# Patient Record
Sex: Female | Born: 1945 | Race: Black or African American | Hispanic: No | Smoking: Former smoker
Health system: Southern US, Community
[De-identification: ages and names within clinical notes are randomized; demographics above are authoritative.]

## PROBLEM LIST (undated history)

## (undated) DIAGNOSIS — I10 Essential (primary) hypertension: Secondary | ICD-10-CM

## (undated) DIAGNOSIS — K449 Diaphragmatic hernia without obstruction or gangrene: Secondary | ICD-10-CM

## (undated) DIAGNOSIS — R921 Mammographic calcification found on diagnostic imaging of breast: Secondary | ICD-10-CM

## (undated) DIAGNOSIS — K219 Gastro-esophageal reflux disease without esophagitis: Secondary | ICD-10-CM

## (undated) DIAGNOSIS — K209 Esophagitis, unspecified without bleeding: Secondary | ICD-10-CM

## (undated) DIAGNOSIS — E876 Hypokalemia: Secondary | ICD-10-CM

## (undated) DIAGNOSIS — D649 Anemia, unspecified: Secondary | ICD-10-CM

## (undated) DIAGNOSIS — D509 Iron deficiency anemia, unspecified: Secondary | ICD-10-CM

## (undated) DIAGNOSIS — K750 Abscess of liver: Secondary | ICD-10-CM

## (undated) DIAGNOSIS — E559 Vitamin D deficiency, unspecified: Secondary | ICD-10-CM

## (undated) DIAGNOSIS — K759 Inflammatory liver disease, unspecified: Secondary | ICD-10-CM

## (undated) HISTORY — DX: Essential (primary) hypertension: I10

## (undated) HISTORY — DX: Diaphragmatic hernia without obstruction or gangrene: K44.9

## (undated) HISTORY — DX: Iron deficiency anemia, unspecified: D50.9

## (undated) HISTORY — PX: BREAST EXCISIONAL BIOPSY: SUR124

## (undated) HISTORY — DX: Esophagitis, unspecified: K20.9

## (undated) HISTORY — DX: Hypokalemia: E87.6

## (undated) HISTORY — DX: Vitamin D deficiency, unspecified: E55.9

## (undated) HISTORY — DX: Esophagitis, unspecified without bleeding: K20.90

## (undated) HISTORY — PX: TUBAL LIGATION: SHX77

## (undated) HISTORY — DX: Mammographic calcification found on diagnostic imaging of breast: R92.1

## (undated) HISTORY — DX: Anemia, unspecified: D64.9

## (undated) HISTORY — DX: Abscess of liver: K75.0

---

## 2002-08-24 ENCOUNTER — Emergency Department (HOSPITAL_COMMUNITY): Admission: EM | Admit: 2002-08-24 | Discharge: 2002-08-24 | Payer: Self-pay | Admitting: Emergency Medicine

## 2005-07-05 HISTORY — PX: LIVER BIOPSY: SHX301

## 2006-01-21 ENCOUNTER — Inpatient Hospital Stay (HOSPITAL_COMMUNITY): Admission: EM | Admit: 2006-01-21 | Discharge: 2006-02-09 | Payer: Self-pay | Admitting: Family Medicine

## 2006-02-14 ENCOUNTER — Ambulatory Visit (HOSPITAL_COMMUNITY): Admission: RE | Admit: 2006-02-14 | Discharge: 2006-02-14 | Payer: Self-pay | Admitting: Internal Medicine

## 2006-02-14 ENCOUNTER — Ambulatory Visit: Payer: Self-pay | Admitting: Internal Medicine

## 2006-02-15 ENCOUNTER — Ambulatory Visit: Payer: Self-pay | Admitting: *Deleted

## 2006-03-14 ENCOUNTER — Ambulatory Visit: Payer: Self-pay | Admitting: Internal Medicine

## 2006-03-17 ENCOUNTER — Ambulatory Visit (HOSPITAL_COMMUNITY): Admission: RE | Admit: 2006-03-17 | Discharge: 2006-03-17 | Payer: Self-pay | Admitting: Family Medicine

## 2006-03-21 ENCOUNTER — Ambulatory Visit: Payer: Self-pay | Admitting: Internal Medicine

## 2006-10-19 ENCOUNTER — Ambulatory Visit: Payer: Self-pay | Admitting: Internal Medicine

## 2006-11-02 ENCOUNTER — Ambulatory Visit (HOSPITAL_COMMUNITY): Admission: RE | Admit: 2006-11-02 | Discharge: 2006-11-02 | Payer: Self-pay | Admitting: Internal Medicine

## 2006-11-02 ENCOUNTER — Ambulatory Visit: Payer: Self-pay | Admitting: Family Medicine

## 2006-11-04 ENCOUNTER — Ambulatory Visit: Payer: Self-pay | Admitting: Internal Medicine

## 2006-11-24 ENCOUNTER — Ambulatory Visit: Payer: Self-pay | Admitting: Internal Medicine

## 2007-03-01 ENCOUNTER — Ambulatory Visit: Payer: Self-pay | Admitting: Internal Medicine

## 2007-03-01 LAB — CONVERTED CEMR LAB
Basophils Relative: 0 % (ref 0–1)
Eosinophils Relative: 4 % (ref 0–5)
Hemoglobin: 11.1 g/dL — ABNORMAL LOW (ref 12.0–15.0)
MCHC: 32.2 g/dL (ref 30.0–36.0)
Monocytes Absolute: 0.4 10*3/uL (ref 0.2–0.7)
Platelets: 321 10*3/uL (ref 150–400)
RBC: 4.29 M/uL (ref 3.87–5.11)
RDW: 18 % — ABNORMAL HIGH (ref 11.5–14.0)
Retic Count, Absolute: 34.3 (ref 19.0–186.0)

## 2007-03-22 ENCOUNTER — Encounter (INDEPENDENT_AMBULATORY_CARE_PROVIDER_SITE_OTHER): Payer: Self-pay | Admitting: *Deleted

## 2007-05-04 ENCOUNTER — Ambulatory Visit: Payer: Self-pay | Admitting: Internal Medicine

## 2007-05-17 ENCOUNTER — Ambulatory Visit: Payer: Self-pay | Admitting: Internal Medicine

## 2007-07-17 ENCOUNTER — Ambulatory Visit: Payer: Self-pay | Admitting: Internal Medicine

## 2007-07-27 ENCOUNTER — Ambulatory Visit: Payer: Self-pay | Admitting: Internal Medicine

## 2007-08-03 ENCOUNTER — Ambulatory Visit: Payer: Self-pay | Admitting: Internal Medicine

## 2007-08-09 ENCOUNTER — Ambulatory Visit: Payer: Self-pay | Admitting: Family Medicine

## 2007-08-16 ENCOUNTER — Ambulatory Visit: Payer: Self-pay | Admitting: Internal Medicine

## 2007-09-20 ENCOUNTER — Ambulatory Visit: Payer: Self-pay | Admitting: Internal Medicine

## 2007-11-04 IMAGING — CR DG CHEST 2V
2 series · 2 of 2 positions shown · non-contrast
Comparison: None

CLINICAL DATA: Chest pain, cough

CHEST - 2 VIEW:

[view not recorded (1 of 2)]
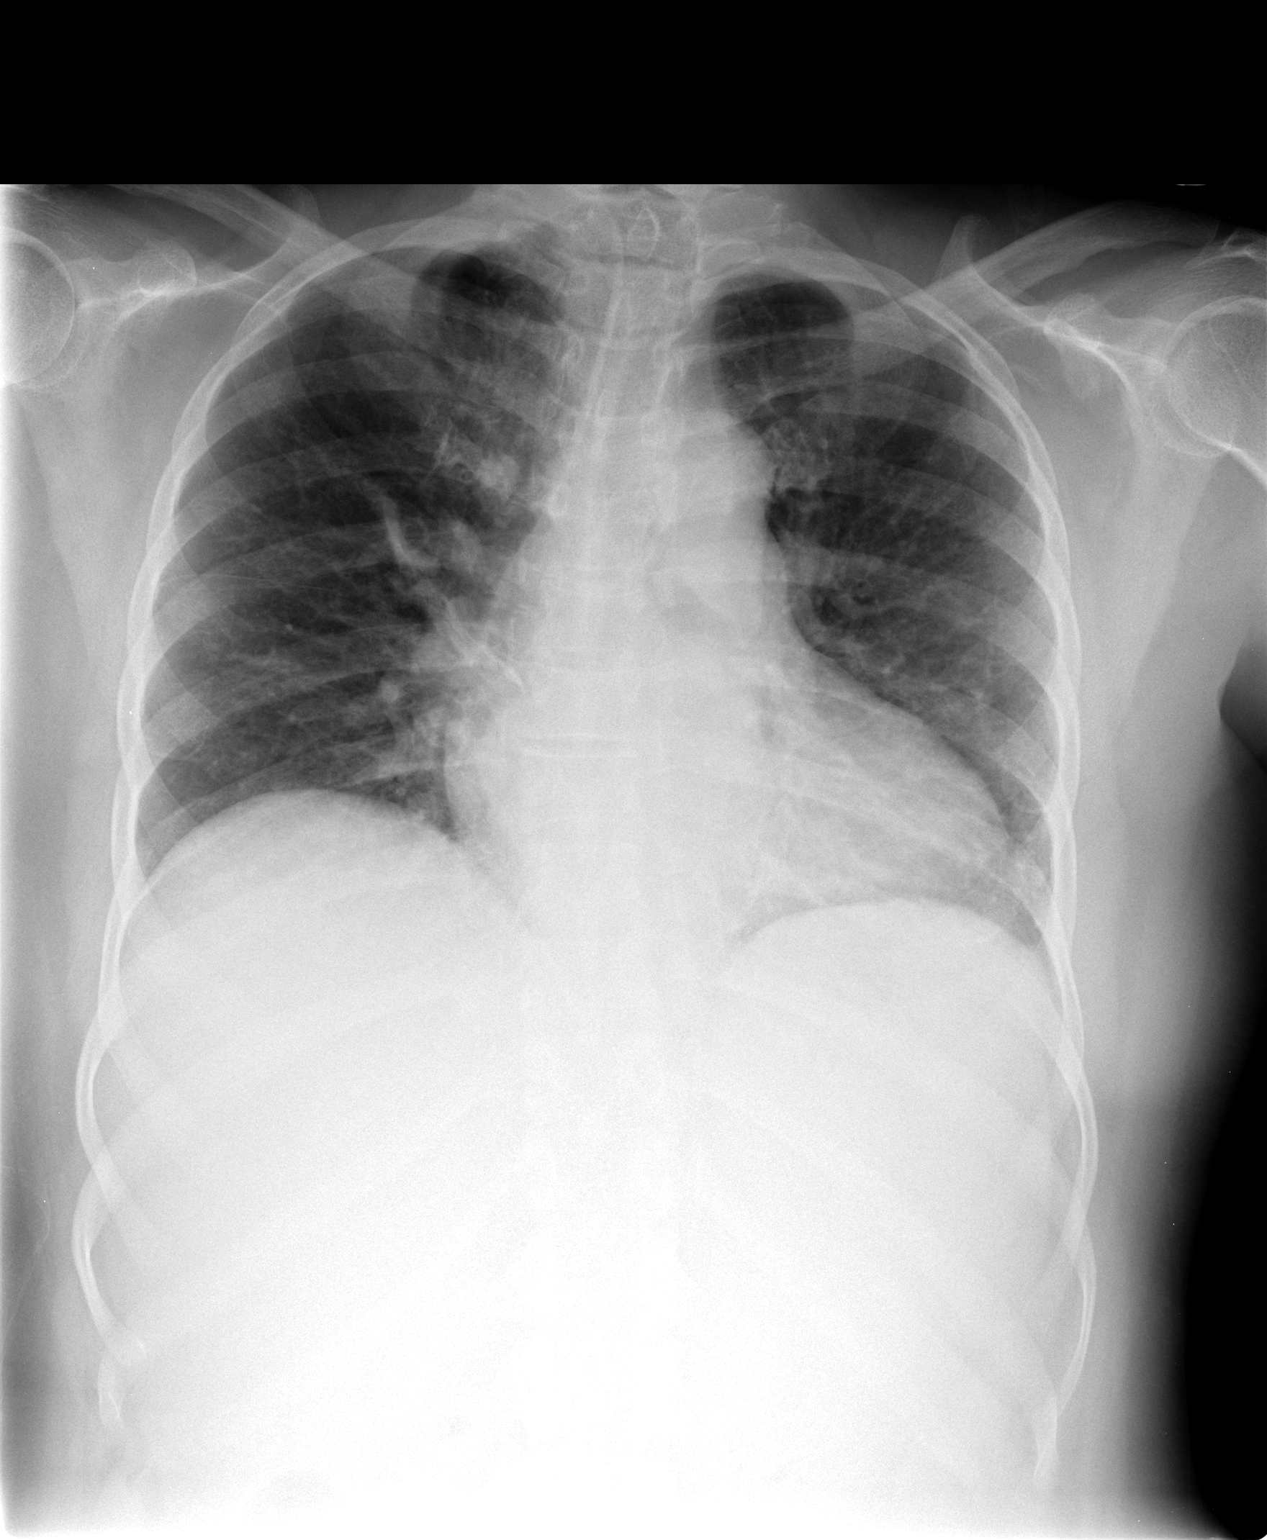

[view not recorded (2 of 2)]
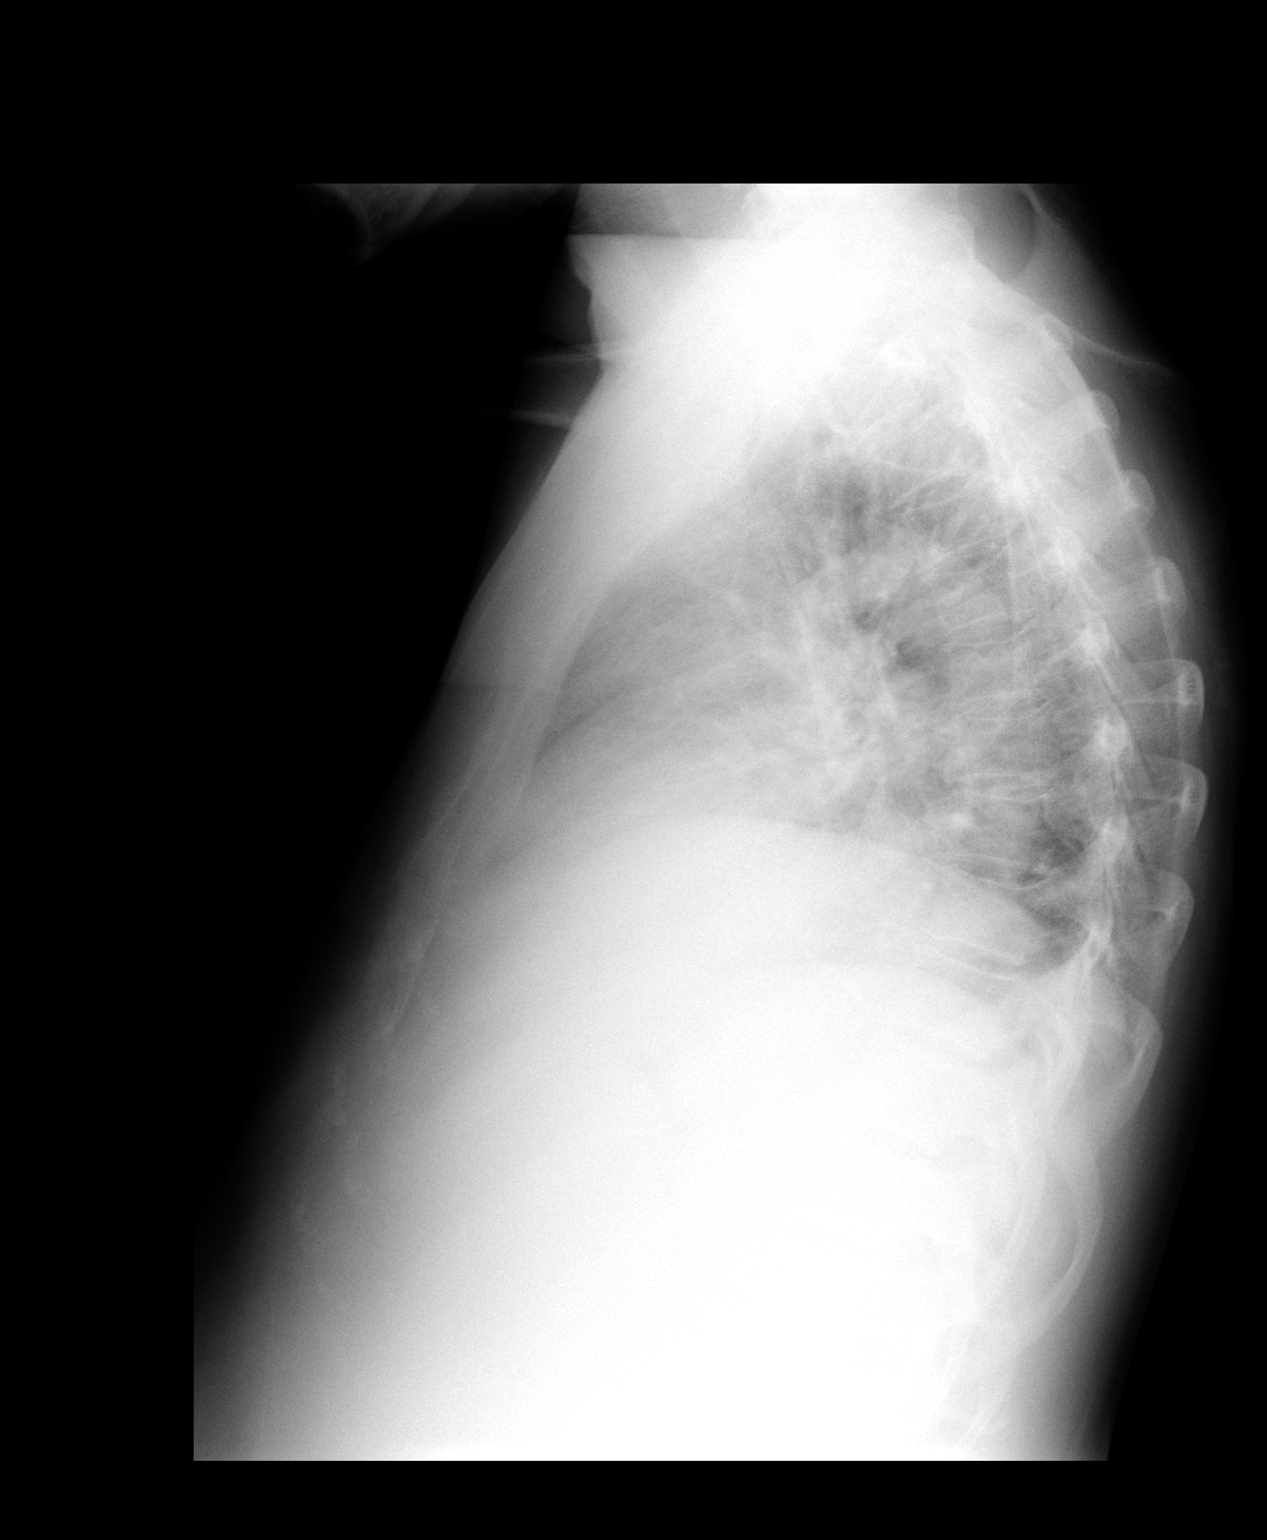

[2 of 2 positions shown; findings below may reference images not displayed]

FINDINGS: There is cardiomegaly. Vascular congestion noted. Mild interstitial
prominence noted which may represent mild interstitial edema. There are low lung
volumes. No definite effusions.
IMPRESSION: Cardiomegaly, vascular congestion, possible mild interstitial edema. Low
volumes.

## 2007-11-05 IMAGING — CR DG CHEST 1V PORT
1 series · 1 of 1 positions shown · non-contrast
Comparison: 01/21/06.
PORTABLE CHEST - 1 VIEW:

CLINICAL DATA: PICC placement.

[view not recorded]
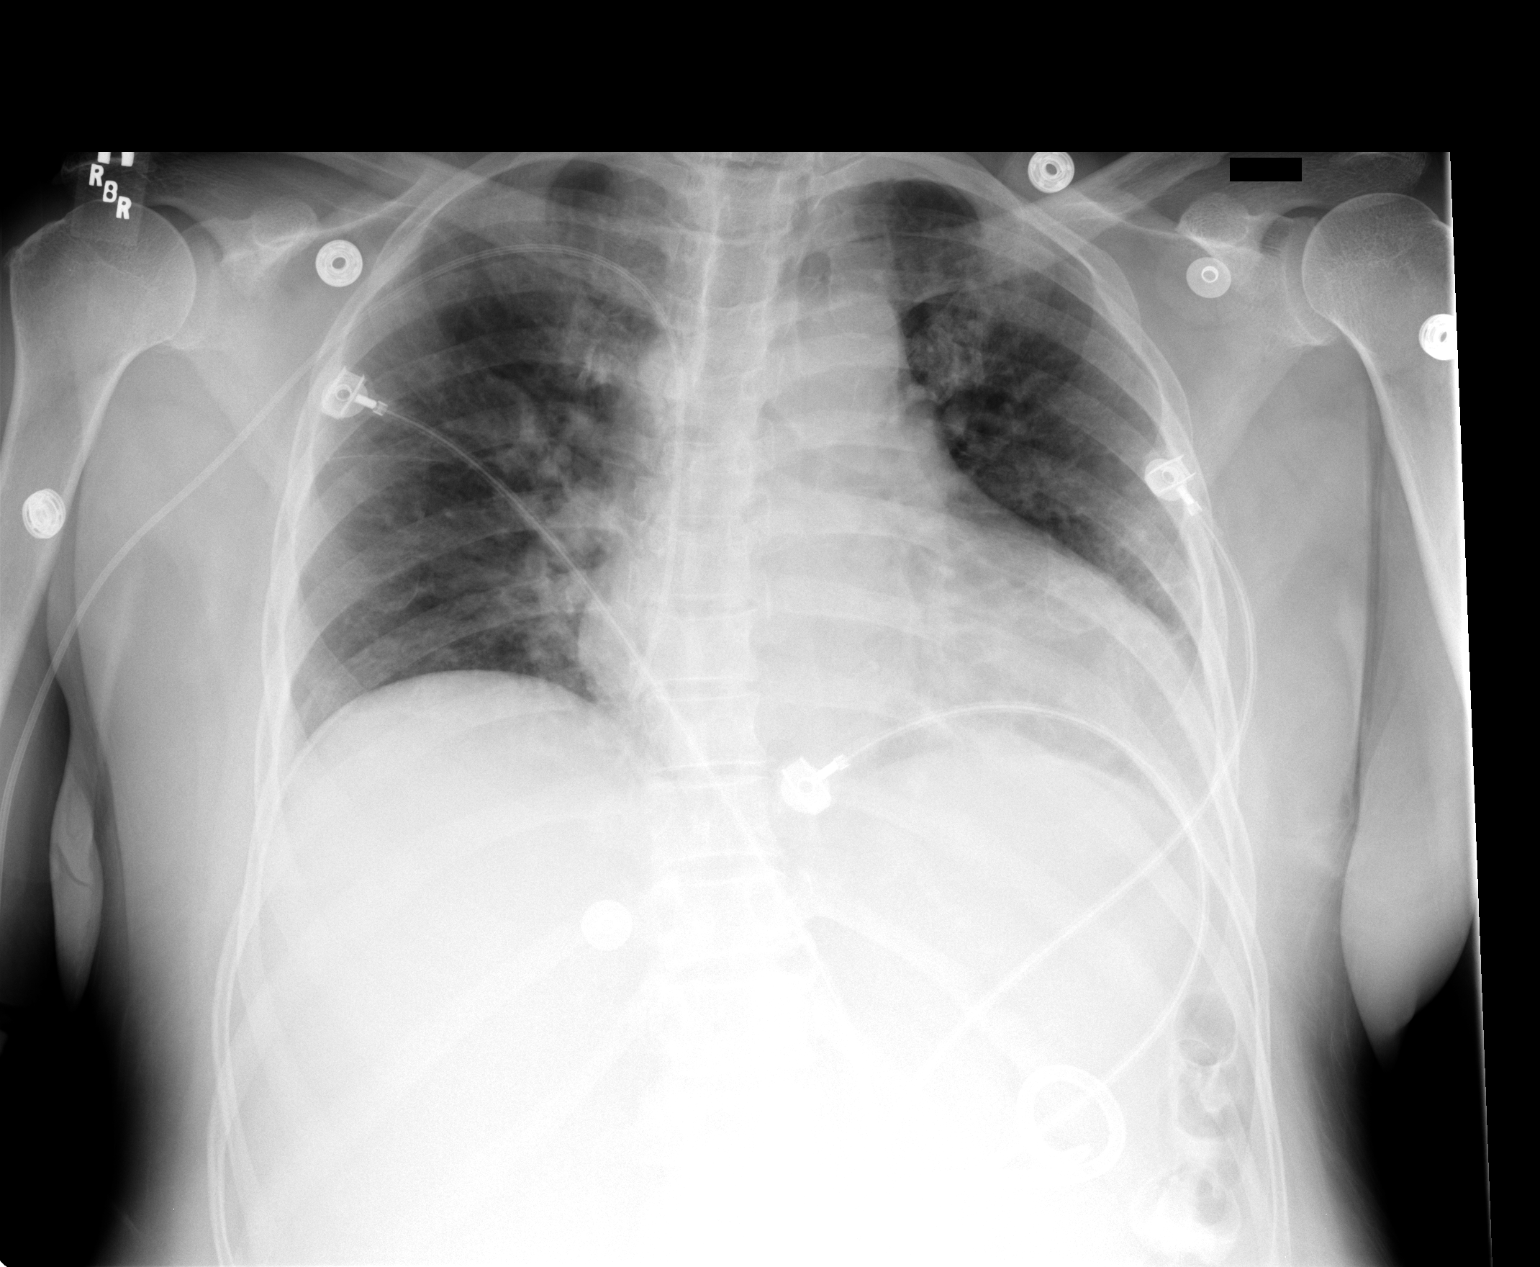

[1 of 1 positions shown; findings below may reference images not displayed]

FINDINGS: The patient has a new right-sided PICC with the tip deep in the right atrium.  Recommend withdrawal of approximately 4.5 cm.  No pneumothorax.  Lung volumes are low with crowding of the bronchovascular structures.  There is no effusion.  Note is made of a tube projecting over the left upper quadrant for drainage of hepatic abscess.
IMPRESSION: PICC in place with tip in the right atrium.  Recommend withdrawal of 4.5 cm.

## 2007-11-18 IMAGING — CT CT PELVIS W/ CM
2 of 5 series · 17 of 46 positions shown, 19 images · IV contrast (APPLIED)
Comparison: none

CLINICAL DATA: Hepatic abscess

[Series 2: abd/pelv with 5.0 b31f st · axial · 0.75mm/px · z∈[+980,+1345]mm · 14 of 83 slices shown, 16 images]
[im 5/83  soft-tissue]
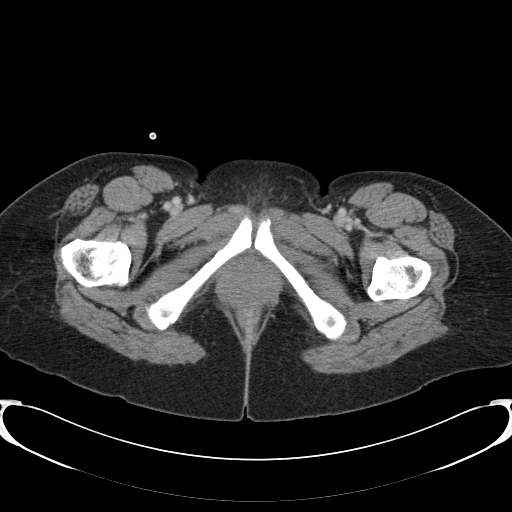
[im 5/83  bone]
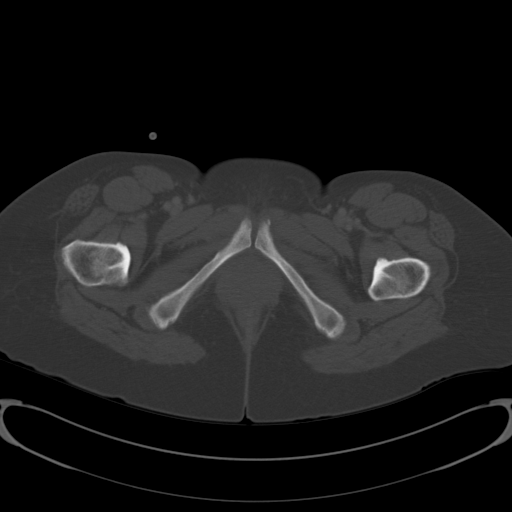
[im 10/83  soft-tissue]
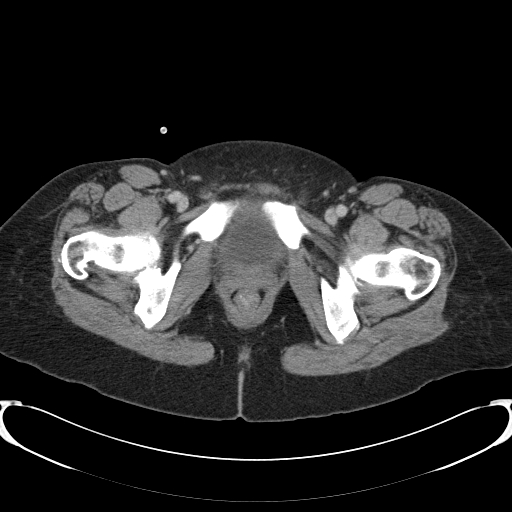
[im 15/83  soft-tissue]
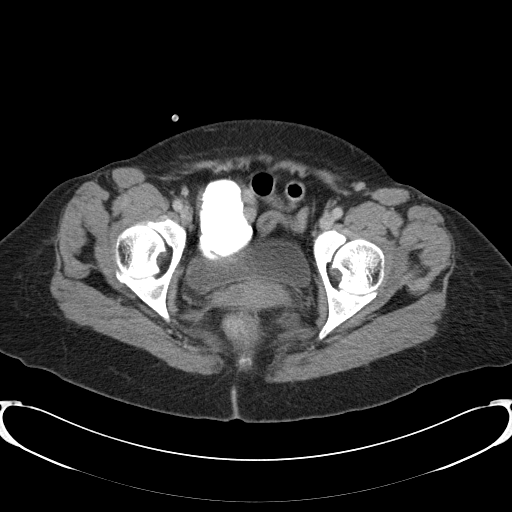
[im 25/83  soft-tissue]
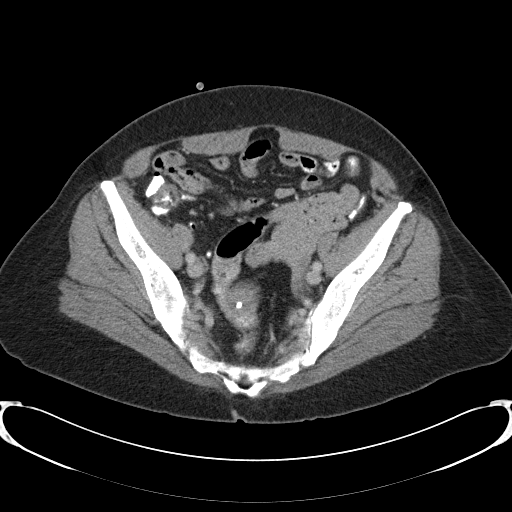
[im 29/83  soft-tissue]
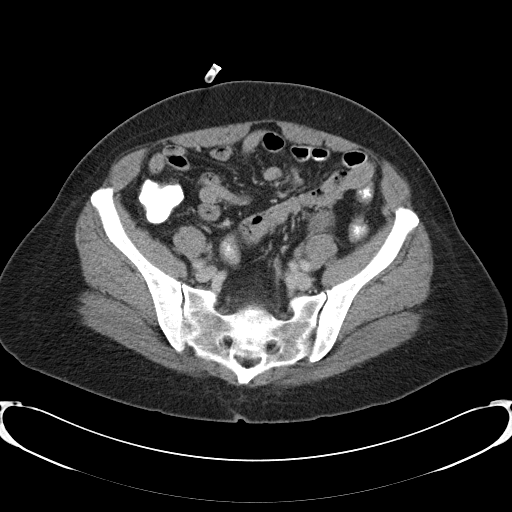
[im 34/83  soft-tissue]
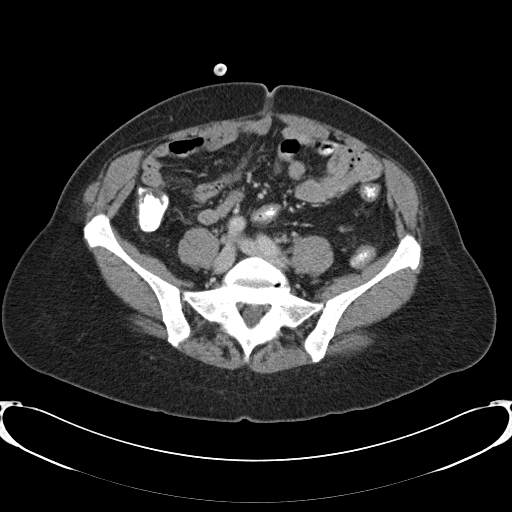
[im 39/83  soft-tissue]
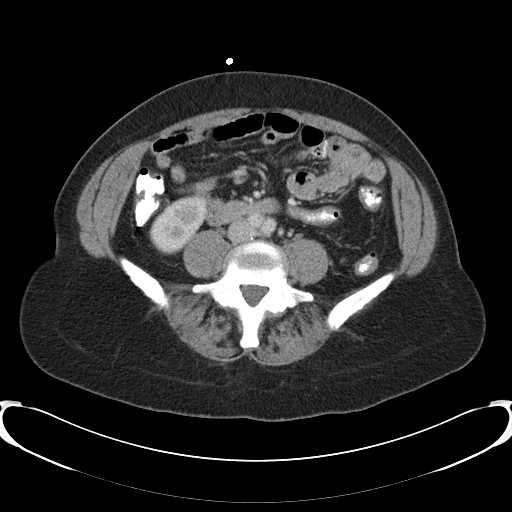
[im 44/83  soft-tissue]
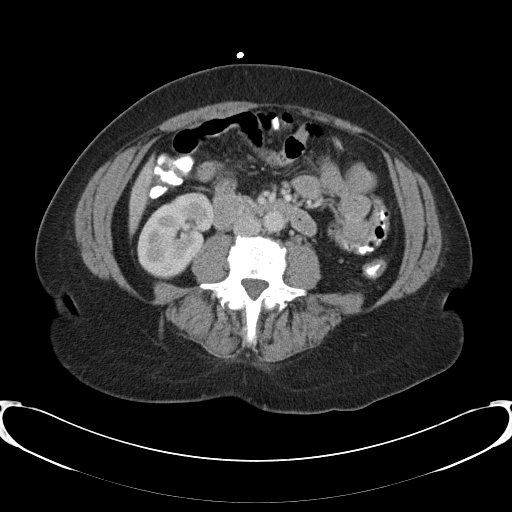
[im 49/83  soft-tissue]
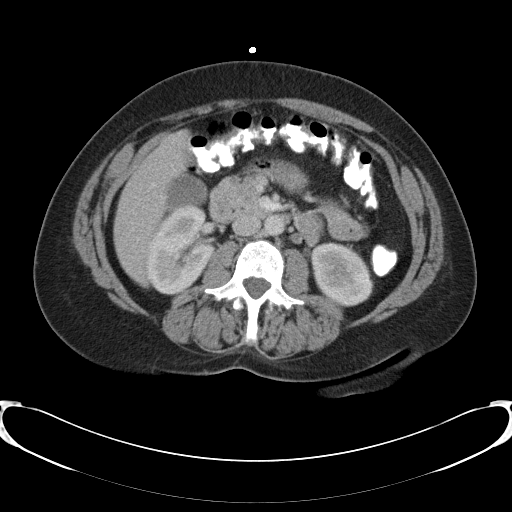
[im 49/83  bone]
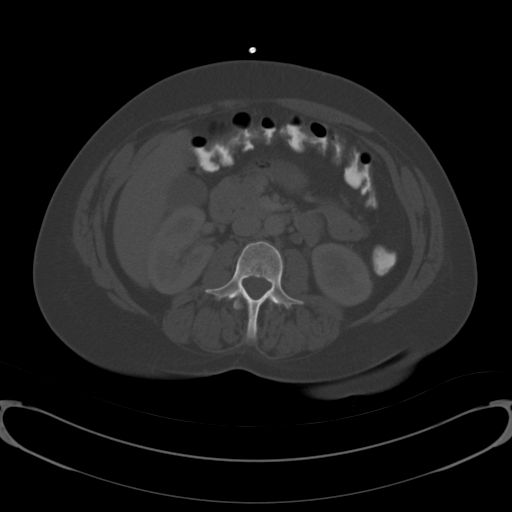
[im 54/83  soft-tissue]
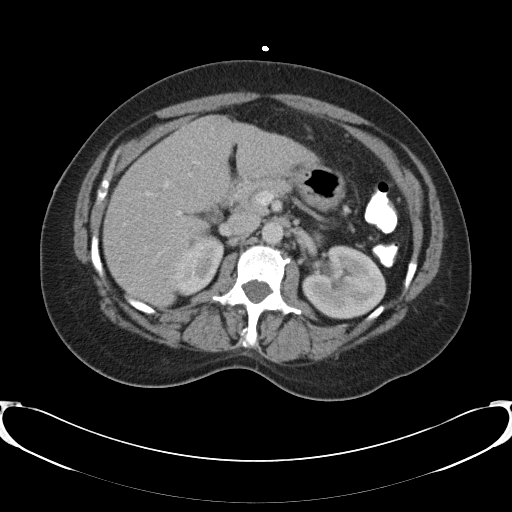
[im 63/83  soft-tissue]
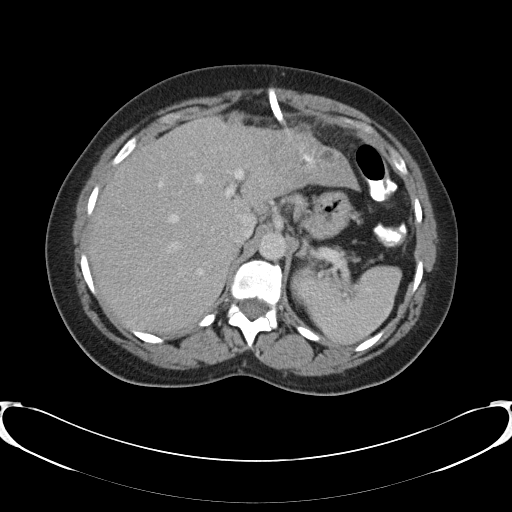
[im 68/83  soft-tissue]
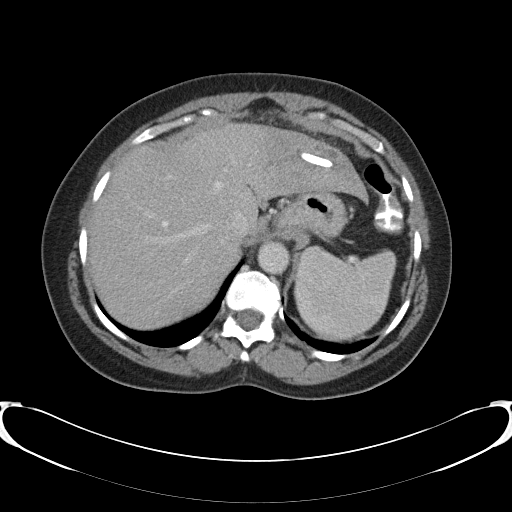
[im 73/83  soft-tissue]
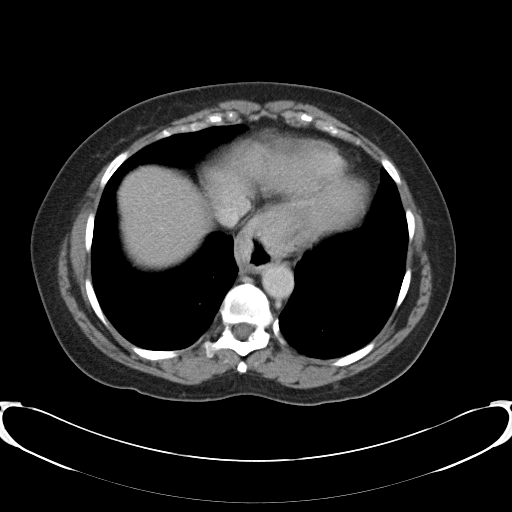
[im 78/83  soft-tissue]
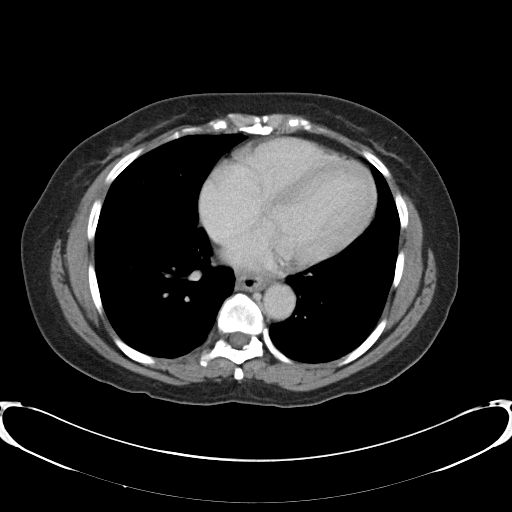

[Series 602: coronal · coronal · 0.81mm/px · 3 of 114 slices shown]
[im 38/114  soft-tissue]
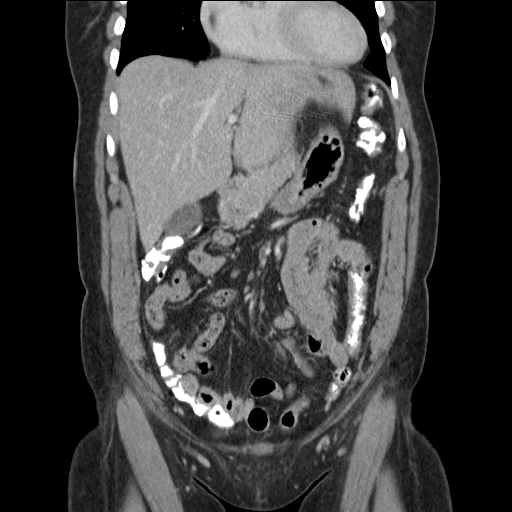
[im 51/114  soft-tissue]
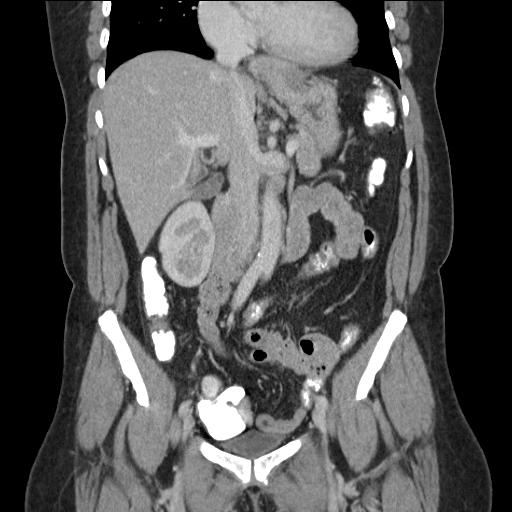
[im 63/114  soft-tissue]
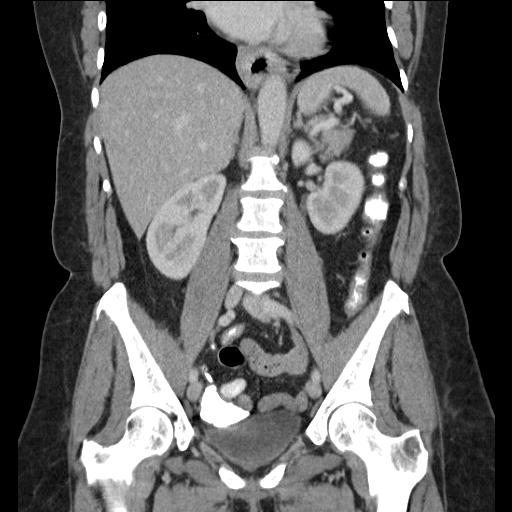

[17 of 46 positions shown; findings below may reference images not displayed]

CT abdomen with contrast:

Multidetector helical CT after 100 ml Omnipaque-QFF IV.
Comparison 01/31/2006. Visualized lung bases clear. Percutaneous drain catheter
remains in the lateral left hepatic segment. Further decrease in the amount of
fluid around the drain catheter, with several small loculations still evident,
largest immediately anterior to the drain catheter 9 x 19 mm in maximum
transverse dimensions. The left hepatic vein still does not enhance and is
probably thrombosed. Further decrease in size of subcapsular or perihepatic
collection now only 7 x 7 mm. No new fluid collections. Unremarkable spleen,
adrenal glands, kidneys, pancreas. Moderate hiatal hernia. Portal vein and major
intrahepatic branches remain patent. Mild atheromatous changes in the abdominal
aorta without aneurysm. Degenerative changes in the lumbar spine. Small bowel
decompressed. No free air. No ascites. Delayed scans show normal excretion by
both kidneys into decompressed collecting systems.
IMPRESSION: 1. Continued improvement in small residual hepatic abscess loculations in the
region of the drain catheter. No new or enlarged component.

CT pelvis with contrast: 

Normal appendix. Colon is nondilated, unremarkable. Several partially calcified
uterine fibroids. Urinary bladder incompletely distended. No free fluid. Right
pelvic phleboliths. No adenopathy localized. Coronal and sagittal
reconstructions confirm the above findings.
IMPRESSION: 1. No acute pelvic process.

## 2007-11-29 ENCOUNTER — Ambulatory Visit: Payer: Self-pay | Admitting: Internal Medicine

## 2008-01-15 ENCOUNTER — Ambulatory Visit: Payer: Self-pay | Admitting: Internal Medicine

## 2008-01-15 LAB — CONVERTED CEMR LAB
Alkaline Phosphatase: 69 units/L (ref 39–117)
BUN: 27 mg/dL — ABNORMAL HIGH (ref 6–23)
Chloride: 94 meq/L — ABNORMAL LOW (ref 96–112)
Creatinine, Ser: 1.16 mg/dL (ref 0.40–1.20)
Glucose, Bld: 116 mg/dL — ABNORMAL HIGH (ref 70–99)
HCT: 43.3 % (ref 36.0–46.0)
Hemoglobin: 14 g/dL (ref 12.0–15.0)
Lymphocytes Relative: 43 % (ref 12–46)
Monocytes Absolute: 0.5 10*3/uL (ref 0.1–1.0)
Monocytes Relative: 8 % (ref 3–12)
Potassium: 3.2 meq/L — ABNORMAL LOW (ref 3.5–5.3)
RBC: 4.83 M/uL (ref 3.87–5.11)
Sodium: 136 meq/L (ref 135–145)
WBC: 5.8 10*3/uL (ref 4.0–10.5)

## 2008-02-13 ENCOUNTER — Ambulatory Visit: Payer: Self-pay | Admitting: Internal Medicine

## 2008-09-15 ENCOUNTER — Emergency Department (HOSPITAL_COMMUNITY): Admission: EM | Admit: 2008-09-15 | Discharge: 2008-09-15 | Payer: Self-pay | Admitting: Emergency Medicine

## 2008-09-23 ENCOUNTER — Ambulatory Visit: Payer: Self-pay | Admitting: Family Medicine

## 2008-11-27 ENCOUNTER — Ambulatory Visit: Payer: Self-pay | Admitting: Internal Medicine

## 2009-03-05 ENCOUNTER — Ambulatory Visit: Payer: Self-pay | Admitting: Internal Medicine

## 2009-03-05 LAB — CONVERTED CEMR LAB
Basophils Absolute: 0 10*3/uL (ref 0.0–0.1)
CO2: 24 meq/L (ref 19–32)
Creatinine, Ser: 0.98 mg/dL (ref 0.40–1.20)
Eosinophils Absolute: 0.2 10*3/uL (ref 0.0–0.7)
Hemoglobin: 11 g/dL — ABNORMAL LOW (ref 12.0–15.0)
Lymphocytes Relative: 31 % (ref 12–46)
Lymphs Abs: 2 10*3/uL (ref 0.7–4.0)
Monocytes Relative: 6 % (ref 3–12)
Neutro Abs: 3.9 10*3/uL (ref 1.7–7.7)
Platelets: 272 10*3/uL (ref 150–400)
RBC: 3.93 M/uL (ref 3.87–5.11)
RDW: 13.9 % (ref 11.5–15.5)

## 2009-04-02 ENCOUNTER — Ambulatory Visit: Payer: Self-pay | Admitting: Internal Medicine

## 2009-04-30 ENCOUNTER — Ambulatory Visit: Payer: Self-pay | Admitting: Internal Medicine

## 2009-06-04 ENCOUNTER — Ambulatory Visit: Payer: Self-pay | Admitting: Internal Medicine

## 2009-06-24 ENCOUNTER — Ambulatory Visit: Payer: Self-pay | Admitting: Internal Medicine

## 2009-07-08 ENCOUNTER — Ambulatory Visit (HOSPITAL_COMMUNITY): Admission: RE | Admit: 2009-07-08 | Discharge: 2009-07-08 | Payer: Self-pay | Admitting: Internal Medicine

## 2009-09-09 ENCOUNTER — Telehealth (INDEPENDENT_AMBULATORY_CARE_PROVIDER_SITE_OTHER): Payer: Self-pay | Admitting: *Deleted

## 2009-09-16 ENCOUNTER — Ambulatory Visit: Payer: Self-pay | Admitting: Internal Medicine

## 2010-02-10 ENCOUNTER — Ambulatory Visit: Payer: Self-pay | Admitting: Family Medicine

## 2010-02-10 ENCOUNTER — Encounter (INDEPENDENT_AMBULATORY_CARE_PROVIDER_SITE_OTHER): Payer: Self-pay | Admitting: Internal Medicine

## 2010-02-10 LAB — CONVERTED CEMR LAB
BUN: 18 mg/dL (ref 6–23)
Calcium: 9.1 mg/dL (ref 8.4–10.5)
Chloride: 104 meq/L (ref 96–112)
Creatinine, Ser: 0.99 mg/dL (ref 0.40–1.20)
Hemoglobin: 11.2 g/dL — ABNORMAL LOW (ref 12.0–15.0)
Lymphocytes Relative: 38 % (ref 12–46)
Lymphs Abs: 2.3 10*3/uL (ref 0.7–4.0)
MCV: 88.9 fL (ref 78.0–100.0)
Monocytes Relative: 6 % (ref 3–12)
Platelets: 249 10*3/uL (ref 150–400)
RBC: 4.05 M/uL (ref 3.87–5.11)
Sodium: 139 meq/L (ref 135–145)
WBC: 6.1 10*3/uL (ref 4.0–10.5)

## 2010-07-10 ENCOUNTER — Ambulatory Visit (HOSPITAL_COMMUNITY): Admission: RE | Admit: 2010-07-10 | Payer: Self-pay | Source: Home / Self Care | Admitting: Internal Medicine

## 2010-07-10 ENCOUNTER — Encounter
Admission: RE | Admit: 2010-07-10 | Discharge: 2010-07-10 | Payer: Self-pay | Source: Home / Self Care | Attending: Internal Medicine | Admitting: Internal Medicine

## 2010-08-04 NOTE — Progress Notes (Signed)
Summary: triage/cough and cold  Phone Note Call from Patient   Caller: Patient Reason for Call: Talk to Nurse Summary of Call: Patient states she has had a cold since Sunday.Marland KitchenMarland KitchenShe has been around others that have been sick with the same..She states she has a cough which is non productive and pain in her sinuses on the left side..She has a history of HTN.Marland Kitchenand has been drinking hot tea with lemon and honey..She has an advair inhaler she uses when needed.Marland KitchenMarland KitchenDenies fever or difficulty breathing.Marland KitchenMarland KitchenAdvised patient to try OTC coricidin HB and use her inhaler when needed.Marland KitchenMarland KitchenDrink plenty of fluids and she can use a humidifier.Marland KitchenMarland KitchenAdvised patient to call back if needed if s/s worsen or difficulty breathing.Marland KitchenMarland KitchenIt sounds like this is a virus that is going around and it may take a few days or more before she starts to feel better.Marland KitchenMarland KitchenWash hands. Initial call taken by: Conchita Paris,  September 09, 2009 9:23 AM     Appended Document: triage/cough and cold Patient called back 3/14...not any better ..has been using OTC medications.Marland KitchenMarland KitchenMucinex DM.Marland Kitchenand Zyrtec and has pain in her sinuses..States that she can't sleep.Marland KitchenMarland Kitchen

## 2010-11-20 NOTE — Op Note (Signed)
Nicole Robbins, Nicole Robbins                ACCOUNT NO.:  [id number]   MEDICAL RECORD NO.:  [id number]          PATIENT TYPE:  INP   LOCATION:  5524                         FACILITY:  [facility]   PHYSICIAN:  John C. Madilyn Fireman, M.D.    DATE OF BIRTH:  11-12-1945   DATE OF PROCEDURE:  02/03/2006  DATE OF DISCHARGE:                                 OPERATIVE REPORT   INDICATIONS FOR PROCEDURE:  Anemia and heme-positive stools.   PROCEDURE:  The patient was placed in the left lateral decubitus position  and placed on the pulse monitor with continuous low-flow oxygen delivered by  nasal cannula.  She was sedated with a 20 mcg of IV fentanyl and 4 mg IV  Versed in addition to the medicine given for the previous EGD.  Olympus  video colonoscope was inserted into the rectum and advanced to the cecum,  confirmed by transillumination of McBurney's point and visualization of the  ileocecal valve and appendiceal orifice.  The prep was excellent.  The  cecum, ascending, transverse, descending, sigmoid and rectum all appeared  normal with no masses, polyps, diverticula or other mucosal abnormalities.  The retroflexed view of the anus revealed no obvious internal hemorrhoids.  The scope was then withdrawn and the patient returned to the recovery room  in stable condition.  She tolerated the procedure well.  There were no  immediate complications.           ______________________________  Everardo All Madilyn Fireman, M.D.     JCH/MEDQ  D:  02/03/2006  T:  02/03/2006  Job:  [id number]   cc:   Georgann Housekeeper, MD

## 2010-11-20 NOTE — Consult Note (Signed)
Nicole Robbins, Nicole Robbins NO.:  [id number]   MEDICAL RECORD NO.:  [id number]          PATIENT TYPE:  INP   LOCATION:  4731                         FACILITY:  [facility]   PHYSICIAN:  Petra Kuba, M.D.    DATE OF BIRTH:  [birth year]   DATE OF CONSULTATION:  DATE OF DISCHARGE:                                   CONSULTATION   DATE OF CONSULTATION:  January 22, 2006   HISTORY:  The patient is seen at the request of Dr. Lendell Caprice and Dr. Donette Larry  for 2 weeks history of abdominal discomfort, abnormal CAT scan, guaiac  positive microcytic anemia.  She had no previous GI problems or any previous  GI workup.  CT did show a hepatic abscess.  She has no significant travel  history and has not seen any blood in her bowels prior to 2 weeks ago when  she had a cough and weakness.  Really did not have much GI problems.   PAST MEDICAL HISTORY:  Negative.   PAST SURGICAL HISTORY:  Negative.   ALLERGIES:  TETRACYCLINE.   MEDICATIONS:  None.   FAMILY HISTORY:  Negative for any obvious GI problems.   SOCIAL HISTORY:  Denies tobacco or alcohol.  Will use some Goody's.  Not  everyday.  Has not had any significant travel exposures.   REVIEW OF SYSTEMS:  Negative except above.   PHYSICAL EXAMINATION:  Vitals:  See chart.  Abdomen is soft.  She is a  little tender in the mid epigastric area.  She did have a fever on  admission.  White count was 25.  Hemoglobin 4.8 with a MCV of 58.  BUN and  creatinine were normal.  Liver tests normal.  CT scan reviewed with Dr.  Molli Posey and Dr. Deanne Coffer pertinent for a large abscess.  View of the colon  appear ok.   ASSESSMENT:  1.  Hepatic abscess.  2.  Significant microcytic anemia and guaiac positivity.  Questionable      etiology.   PLAN:  Probably proceed with a percutaneous drainage.  The radiologists want  surgery on standby incase a problem.  Dr. Deanne Coffer was to discuss that with  Dr. Lendell Caprice.  We will go ahead and get amebic serology just to  be sure  although this is probably bacterial and most likely causes are usually  gallbladder, biliary tract or diverticulitis but she had not had any obvious  problems with those.  Once the abscess is stable we will need GI workup for  anemia guaiac positivity.  Probably would begin with a colonoscopy first.  Might want to get a gallbladder  ultrasound just to rule out stones as a possibility.  We will follow with  you.  Please let us know when we can help or when you believe stable for  further workup and certainly if she continues to drop to her hemoglobin and  have signs of GI bleeding we perceive the sooner p.r.n.           ______________________________  Petra Kuba, M.D.     MEM/MEDQ  D:  01/22/2006  T:  01/22/2006  Job:  [id number]   cc:   Petra Kuba, M.D.  Fax: [phone]

## 2010-11-20 NOTE — Discharge Summary (Signed)
Nicole Robbins, Nicole Robbins                ACCOUNT NO.:  [id number]   MEDICAL RECORD NO.:  [id number]          PATIENT TYPE:  INP   LOCATION:  5531                         FACILITY:  [facility]   PHYSICIAN:  Melissa L. Ladona Ridgel, MD  DATE OF BIRTH:  1945-10-22   DATE OF ADMISSION:  01/21/2006  DATE OF DISCHARGE:  02/09/2006                                 DISCHARGE SUMMARY   CHIEF COMPLAINT:  Abdominal pain, nausea, dizziness and cough.   DISCHARGE DIAGNOSIS:  1. Hepatic abscess.  The patient was diagnosed on CT scan with a hepatic      abscess.  A drain was placed by Interventional Radiology on 01/22/06.      It continues to put out tan colored material consistent with pus.  The      follow up CT scan of the abdomen, however, showed improvement in the      size of the abscess overall.  The patient will need to be closely      followed as an outpatient.  Recommendations for Augmentin twice daily      have been made for antibiotic coverage and she will have assistance      with her drains with home health nursing.  The patient is to follow up      on 02/14/06 as an outpatient for repeat CT scan and Interventional      Radiology is aware of this and will follow along.  We will attempt to      establish a primary care physician with this patient prior to discharge      as she will need an appointment soon after discharge, like Friday of      this week, with follow up CT scanning as needed on Monday.   1. Erosive esophagitis.  On admission the patient was noted to be anemic      with heme positive stools.  She was seen and evaluated by the GI Team      and underwent EGD, colonoscopy.  Results showed significant erosive      esophagitis.  At present, the patient will be discharged to home on      double dose proton pump inhibitor.  The recommended length of treatment      is two months as per Dr. Ewing Schlein.  Please note that the patient is      currently tolerating a full diet and should be re-evaluated by GI  in      the next two months.   1. Anemia, iron deficiency.  Please note the patient, as stated, was found      to have erosive esophagitis and therefore she will be treated for      potential chronic losses with a proton pump inhibitor twice daily for      two months.  I will recommend iron replacement once daily and this can      be increased if she continues to remain with low hemoglobin.   1. Hypertension.  The patient had been on hydrochlorothiazide 25 mg once      daily during the course of  the hospital stay.  She did exhibit elevated      blood pressures in the systolic range of the 180's.  Clonidine 0.1 mg      twice daily was added with good results.  At the time of discharge, the      systolic blood pressures have been running in the 120's.   1. Abdominal pain related to her drains.  The patient will be discharged      to home on Ultram 25 mg t.i.d.   1. Nutritional supplementation with Ensure will be recommended as the      patient has been having difficulty maintaining a full diet during the      course of the hospital stay.  At this time the patient is tolerating a      full diet and does have an appetite.   DISCHARGE MEDICATIONS:  1. Hydrochlorothiazide 25 mg once daily.  2. Ferrous sulfate 325 mg three times daily.  3. Tessalon Perles 100 mg thee times daily as needed for cough.  4. Ensure 237 ml one can three times daily.  5. Augmentin 875 mg three times daily.  This should be re-evaluated for      length of treatment depending on the drainage from her hepatic drain.  6. Ultram 25 mg t.i.d.  Initially the patient was to be sent home on      Prilosec 20 mg daily but I will change this to Nexium 40 mg twice daily      because of the severity of her erosive esophagitis and the need for      twice daily PPI.  7. Clonidine 0.1 mg twice daily.   At this time we are attempting to obtain an Internal Medicine Clinic  appointment for the next day to two, and she will be  instructed to follow up  in outpatient admitting at 9 o'clock on 02/14/06 for a scheduled CT.  She  will be instructed to pick up the contrast for the study on Friday, 02/11/06  or Sunday, 02/13/06.   HOSPITAL COURSE:  The patient is a 65 year old African-American female with  a past medical history significant for no medical illness.  The patient  evidently came to the Urgent Care Center with a two week history of cough,  dizziness, nausea, vomiting, fevers and abdominal pain.  In the Urgent Care  Center she was noted to have a temperature of 102.4 and therefore was sent  to the emergency room for further evaluation.  The patient states that her  cough appeared to get worse around 01/11/06, approximately one week prior to  admission.  The patient also noticed blisters in her mouth and around her  rectum.  She said the cough was occasionally productive but it is not  described any further than that in the notes.  The patient did state that  her belly had become much larger and had significant pain especially when  coughing.  She has had poor appetite and has been very weak.  In the  emergency room the patient was found to be quite anemic with a hemoglobin of  1.8, decreased MCV, hypokalemia of 2.1 and fever of 102.  She therefore was  admitted to the hospital for further care with antibiotics.  Rocephin  initially was started.  She was transfused with three units of packed red  cells and a GI consult was obtained.  Subsequent to completing a consult, CT  scan of the abdomen was obtained which showed a  hepatic abscess.  In light  of the significant finding of hepatic abscess, the patient was referred for  Interventional Radiology placement of a drain with surgical back up should  there be any complications.  The drain was placed on 01/22/06 without  complications.  The patient's antibiotics were converted to Zosyn from Ceftriaxone when it was determined that there was more of a significant   infection than would be appropriately covered by Ceftriaxone.  The patient  was further followed by GI and ultimately once stabilized with regard to  drain placement and her hepatic abscess, underwent EGD, colonoscopy showing  no obvious abnormality on her colonoscopy but severe erosive esophagitis o  her EGD.  The patient was continued on proton pump inhibition at twice daily  and slowly was able to progress with her diet to the point where she had an  appetite that return and she was able to tolerate a normal meal.   The patient's anemia was corrected with packed red blood cells as stated and  she was started on iron which will be continued at home.   Her hospital course was further complicated by elevated blood pressure.  She  had been on hydrochlorothiazide but was noted to have systolic blood  pressure in the 180's.  She therefore was started on Clonidine with good  results.  Her blood pressures have been maintained in the 120's but this  will need to be followed as she may need to be re-evaluated for different  medications once out of the acute phases of her acute illness.   On the day of discharge, the patient is ambulating in the hallways well, she  has been able to manage her own drain, has learned to flush that.  She is  eating well without complaints of nausea, vomiting, shortness of breath or  cough.  The plan is to establish a primary care physician for her to follow  up very closely this week. Unfortunately she is not eligible for house  service, they cannot manage her drain in situ.  We therefore requested  consideration for an appointment with the Internal Medicine Teaching Service  and await a call back with regards to that prior to the patient's discharge.   On the day of discharge, the patient's vital signs are as follows:  T-max is  likely not appropriately documented as 104.  In general her temperatures  have been raising from 97.5 to 98.2.  I suspect the 104  documented in the  computer represents an error in data entry.  I therefore will consider her T-  max on 02/08/06 as 98.2.  She remains afebrile today at 97.7 with a blood  pressure of 103/74, pulse 52, respirations 20, saturations 98%.  In general  this is a moderately developed African-American female in no acute distress.  She is Normocephalic, atraumatic.  Pupils are equal, round and reactive to  light.  Extraocular muscles appear to be intact.  Mucous membranes are  moist.  Neck is supple.  There is no JVD, no significant cardiac bruit.  Chest is decreased but clear to auscultation.  There is no rhonchi, rales or  wheezes.  Cardiovascular is regular rate and rhythm.  Positive S1, S2.  No  S3, S4.  No murmurs, rubs or gallops.  Abdomen is soft, non tender, non  distended with positive bowel sounds.  She does have a drain to Foley bag  containing obviously purulent material, scant in amount, but present. Extremities show no clubbing, cyanosis  or edema.   Please note that during the course of the hospital stay, the patient did  start on Ceftriaxone, was converted to Zosyn and ultimately ended up on  Unasyn for her antibiotic coverage.  She also had Vancomycin added on  01/22/06 but the cultures ultimately grew microaerophilic Strep and therefore  she will be discharged to home only on Augmentin.   Pertinent studies:  As stated the last CT of the abdomen on 02/04/06 shows  improvement in the size of the abscess.  She did undergo an ultrasound on  01/25/06 which showed normal gallbladder, no evidence of cholecystitis or  cholelithiasis.  She has no biliary dilatation.  Her left hepatic area is  heterogenous and has been hypoechoic containing a drainage catheter  consistent with the description of a hepatic abscess.  There is also a  small, 18 mm cyst in the hepatic area.  We were not able to visualize the  pancreas or the distal aorta.   Please note, a chest x-ray completed on 01/22/06 after  PICC placement, shows  that the PICC was in the right atrium about 4.5 cm and it was recommended  that that be pulled back.  Documentation in the chart shows that the PICC  was retracted 4 cm and a dressing was reapplied.   Other pertinent laboratory values reveal the microbiology to be blood  cultures growing microaerophilic streptococci from 01/21/06.  Her abscess  culture is growing microaerophilic streptococci as well.  Fungal cultures  showed no yeast or fungal elements.  AFB cultures showed no acid fast  bacilli and a urine culture showed no growth.   I would recommend as an outpatient that blood cultures be drawn to follow up  the last of the cultures which were positive for microaerophilic  streptococci.  At this time, as the patient is afebrile, I will not require  a stay in the hospital for this follow up.   At this time we will again pursue an attempt to obtain an outpatient  appointment in the Internal Medicine Clinic for close follow up.  The  patient will go home with Augmentin and proton pump inhibition for her  erosive esophagitis.   At the time of discharge, the patient is stable for close follow up as an  outpatient.   DISPOSITION:  To home.      Melissa L. Ladona Ridgel, MD  Electronically Signed     MLT/MEDQ  D:  02/09/2006  T:  02/09/2006  Job:  [id number]   cc:   [facility] Medicine Clinic

## 2010-11-20 NOTE — H&P (Signed)
Nicole Robbins, Nicole Robbins NO.:  [id number]   MEDICAL RECORD NO.:  [id number]          PATIENT TYPE:  EMS   LOCATION:  MAJO                         FACILITY:  [facility]   PHYSICIAN:  Georgann Housekeeper, MD      DATE OF BIRTH:  Nov 26, 1945   DATE OF ADMISSION:  01/21/2006  DATE OF DISCHARGE:                                HISTORY & PHYSICAL   PRIMARY CARE PHYSICIAN:  None.   CHIEF COMPLAINT:  Dizziness, nausea, and cough.   A 65 year old African-American female who had never seen any physician  regularly.  Went to the urgent care with a two week history of cough,  getting worse, dizzy, nausea, some vomiting, had fevers.  Was seen in the  urgent care.  Had a fever of 102.4 and was sent to the emergency room for  further evaluation.  The patient stated that the cough started getting worse  around July 10.  This was about a week ago.  She has had noticed some  blisters in the mouth and the rectum.  The cough was occasionally productive  and fever for the last few days.  She says her belly has been getting a  little bigger and hurting when she coughs.  She is also feeling dizzy and  weak.  Poor appetite.  In the emergency room, she was found to be very  anemic with a hemoglobin of 4.8 with decreased MCV, hypokalemia with  potassium of 2.1 and fever of 102.  Admitted for further evaluation.   PAST MEDICAL HISTORY:  None.   ALLERGIES:  TETRACYCLINE.   CURRENT MEDICATIONS:  None.   PAST SURGICAL HISTORY:  None.   SOCIAL HISTORY:  No tobacco or alcohol.  She is single.  Lives alone.  Her  family is in [place].  One brother deceased.  Three sisters living in [place].  She works as a Comptroller at [employer].   REVIEW OF SYSTEMS:  Denies any rash.  No illicit drugs.  No headaches.  She  was good until about two weeks ago.   PHYSICAL EXAMINATION:  VITAL SIGNS:  Temperature 104.1, T max 102.4, blood  pressure 107/66, pulse 90, respirations 20, sats 100%.  GENERAL:  An awake and  alert, ill-appearing female.  HEENT:  Pupils reactive.  Pale conjunctivae.  LUNGS:  Chronic rhonchi, diffuse.  CARDIAC:  Regular S1 and S2 without murmur.  ABDOMEN:  Protuberant, distended.  Tender.  Decreased bowel sounds  laterally.  Questionable mildly enlarged liver.  EXTREMITIES:  No edema.  No rash.  RECTAL:  Brown stool, guaiac positive.  NEUROLOGIC:  Nonfocal.   LABORATORY DATA:  White count 25,000, hemoglobin 4.8, platelets 466, MCV 58.  Chemistry:  Sodium 133, potassium 2.2, creatinine 0.8, BUN 6, chloride 94,  glucose 141.  LFTs are normal.   Chest x-ray showing interstitial edema, picture with cardiomegaly, no clear  infiltrates.   IMPRESSION:  A 65 year old female with a fever and cough for two weeks.  Severe anemia, hyperkalemia, elevated white count, abdominal swelling.  Problem 1:  Fever:  Differential includes that of respiratory bronchitis  versus  rule out pneumonia, abdominal swelling and questionable ascites.  Problem 2:  Severe anemia.  Problem 3:  Dehydration, hypokalemia.   PLAN:  Admit for IV fluids, IV antibiotics, Rocephin.  We will transfuse her  2 units of packed red blood cells, follow H&H.  GI consult.  Will get an  abdominal pelvic CT to evaluate her abdominal swelling and question ascites.  Will check her iron studies, thyroid, sed rate, and HIV.      Georgann Housekeeper, MD  Electronically Signed     KH/MEDQ  D:  01/21/2006  T:  01/21/2006  Job:  [id number]

## 2010-11-20 NOTE — Op Note (Signed)
NAMECARNELL, Nicole Robbins                ACCOUNT NO.:  [id number]   MEDICAL RECORD NO.:  [id number]          PATIENT TYPE:  INP   LOCATION:  5524                         FACILITY:  [facility]   PHYSICIAN:  John C. Madilyn Fireman, M.D.    DATE OF BIRTH:  [birth year]   DATE OF PROCEDURE:  02/03/2006  DATE OF DISCHARGE:                                 OPERATIVE REPORT   PROCEDURE:  Esophagogastroduodenoscopy.   INDICATIONS FOR PROCEDURE:  Severe anemia and heme-positive stools.   PROCEDURE:  The patient was placed in the left lateral decubitus position  and placed on the pulse monitor, with continuous low-flow oxygen delivered  by nasal cannula.  She was sedated with 80 mcg of IV fentanyl and 8 mg of IV  Versed.  The Olympus endoscope was advanced under direct vision into the  oropharynx and esophagus.  The esophagus was straight and of normal caliber,  with the squamocolumnar line obscured due to severe inflammation.  There was  circumferential exudate and erosions standing up from the gastroesophageal  junction which appeared to be at about 34 cm.  The  visible inflammation  extended to 30 cm.  There was no visible suspicion of neoplasm, and a  question of a slight ring or stricture at the GE junction with no resistance  to passage of the scope through it.  There was a 4 cm hiatal hernia distal  to the apparent GE junction.  The stomach was entered, and a small amount of  liquid secretions were suctioned from the fundus.  Retroflexed view of the  cardia confirmed a hiatal hernia and was otherwise unremarkable.  The  fundus, body, antrum, and pylorus all appeared normal.  The duodenum was  entered.  Both the bulb and second portion were well inspected and appeared  be within normal limits.  The scope was then withdrawn and the patient  prepared for colonoscopy.  She tolerated the procedure well, and there were  no immediate complications.   IMPRESSION:  1.  Severe esophagitis.  2.  Large hiatal  hernia.   PLAN:  1.  Double-dose proton pump inhibitor, and lifestyle and dietary      modifications for reflux.  2.  Proceed to colonoscopy.           ______________________________  Everardo All. Madilyn Fireman, M.D.     JCH/MEDQ  D:  02/03/2006  T:  02/03/2006  Job:  [id number]   cc:   Georgann Housekeeper, MD

## 2011-06-07 ENCOUNTER — Other Ambulatory Visit: Payer: Self-pay | Admitting: Family Medicine

## 2011-06-07 DIAGNOSIS — Z1231 Encounter for screening mammogram for malignant neoplasm of breast: Secondary | ICD-10-CM

## 2011-07-12 ENCOUNTER — Ambulatory Visit
Admission: RE | Admit: 2011-07-12 | Discharge: 2011-07-12 | Disposition: A | Discharge status: Home / Self Care | Payer: Self-pay | Source: Ambulatory Visit | Attending: Family Medicine | Admitting: Family Medicine

## 2011-07-12 DIAGNOSIS — Z1231 Encounter for screening mammogram for malignant neoplasm of breast: Secondary | ICD-10-CM

## 2011-07-16 ENCOUNTER — Other Ambulatory Visit: Payer: Self-pay | Admitting: Family Medicine

## 2011-07-16 DIAGNOSIS — R928 Other abnormal and inconclusive findings on diagnostic imaging of breast: Secondary | ICD-10-CM

## 2011-07-28 ENCOUNTER — Ambulatory Visit
Admission: RE | Admit: 2011-07-28 | Discharge: 2011-07-28 | Disposition: A | Discharge status: Home / Self Care | Payer: Medicare Other | Source: Ambulatory Visit | Attending: Family Medicine | Admitting: Family Medicine

## 2011-07-28 DIAGNOSIS — R928 Other abnormal and inconclusive findings on diagnostic imaging of breast: Secondary | ICD-10-CM

## 2011-08-02 ENCOUNTER — Ambulatory Visit (INDEPENDENT_AMBULATORY_CARE_PROVIDER_SITE_OTHER): Payer: Self-pay | Admitting: General Surgery

## 2011-08-03 ENCOUNTER — Other Ambulatory Visit (INDEPENDENT_AMBULATORY_CARE_PROVIDER_SITE_OTHER): Payer: Self-pay | Admitting: Surgery

## 2011-08-03 ENCOUNTER — Ambulatory Visit (INDEPENDENT_AMBULATORY_CARE_PROVIDER_SITE_OTHER): Payer: Medicaid Other | Admitting: Surgery

## 2011-08-03 ENCOUNTER — Encounter (INDEPENDENT_AMBULATORY_CARE_PROVIDER_SITE_OTHER): Payer: Self-pay | Admitting: Surgery

## 2011-08-03 VITALS — BP 156/82 | HR 70 | Temp 97.8°F | Resp 18 | Ht 63.0 in | Wt 112.0 lb

## 2011-08-03 DIAGNOSIS — R921 Mammographic calcification found on diagnostic imaging of breast: Secondary | ICD-10-CM

## 2011-08-03 DIAGNOSIS — R928 Other abnormal and inconclusive findings on diagnostic imaging of breast: Secondary | ICD-10-CM

## 2011-08-03 HISTORY — DX: Mammographic calcification found on diagnostic imaging of breast: R92.1

## 2011-08-03 NOTE — Progress Notes (Signed)
  CC: Left breast calcifications HPI: This patient recently had routine mammograms. Some moderately suspicious left breast calcifications were noted. They are not amenable to a core biopsy.  The patient has no breast symptoms. She's never had any prior breast problems. She has a negative family history for breast cancer.   ROS: Essentially negative. The patient works actively and consider herself in excellent health  MEDS: No current outpatient prescriptions on file.      ALLERGIES:  Allergies  Allergen Reactions  . Tetracyclines & Related Nausea Only and Other (See Comments)    Made stomach cramp.    PE GENERAL:  The patient is alert, oriented, and generally healthy-appearing, NAD. Mood and affect are normal.  HEENT:  The head is normocephalic, the eyes nonicteric, the pupils were round regular and equal. EOMs are normal. Pharynx normal. Dentition good.  NECK:  The neck is supple and there are no masses or thyromegaly.  LUNGS: Normal respirations and clear to auscultation.  HEART: Regular rhythm, with no murmurs rubs or gallops. Pulses are intact carotid dorsalis pedis and posterior tibial. No significant varicosities are noted.  BREASTS: Small, without mass or other abnormality  ABDOMEN: Soft, flat, and nontender. No masses or organomegaly is noted. No hernias are noted. Bowel sounds are normal.  EXTREMITIES:  Good range of motion, no edema.   Data Reviewed I have reviewed over the mammogram reports. There are some moderately suspicious calcifications noted  Assessment Moderately suspicious calcifications left breast upper outer quadrant  Plan Needle localized excisional biopsy

## 2011-08-04 ENCOUNTER — Encounter (HOSPITAL_BASED_OUTPATIENT_CLINIC_OR_DEPARTMENT_OTHER)
Admission: RE | Admit: 2011-08-04 | Discharge: 2011-08-04 | Disposition: A | Discharge status: Home / Self Care | Payer: Medicare Other | Source: Ambulatory Visit | Attending: Surgery | Admitting: Surgery

## 2011-08-04 ENCOUNTER — Encounter (HOSPITAL_BASED_OUTPATIENT_CLINIC_OR_DEPARTMENT_OTHER): Payer: Self-pay | Admitting: *Deleted

## 2011-08-04 NOTE — Progress Notes (Signed)
To come in for [id number]  

## 2011-08-06 ENCOUNTER — Encounter (HOSPITAL_BASED_OUTPATIENT_CLINIC_OR_DEPARTMENT_OTHER)
Admission: RE | Admit: 2011-08-06 | Discharge: 2011-08-06 | Disposition: A | Discharge status: Home / Self Care | Payer: Medicare Other | Source: Ambulatory Visit | Attending: Surgery | Admitting: Surgery

## 2011-08-06 LAB — BASIC METABOLIC PANEL
BUN: 24 mg/dL — ABNORMAL HIGH (ref 6–23)
CO2: 27 mEq/L (ref 19–32)
Chloride: 100 mEq/L (ref 96–112)
Glucose, Bld: 75 mg/dL (ref 70–99)
Potassium: 3.8 mEq/L (ref 3.5–5.1)
Sodium: 140 mEq/L (ref 135–145)

## 2011-08-09 ENCOUNTER — Ambulatory Visit
Admission: RE | Admit: 2011-08-09 | Discharge: 2011-08-09 | Disposition: A | Discharge status: Home / Self Care | Payer: Medicare Other | Source: Ambulatory Visit | Attending: Surgery | Admitting: Surgery

## 2011-08-09 ENCOUNTER — Encounter (HOSPITAL_BASED_OUTPATIENT_CLINIC_OR_DEPARTMENT_OTHER): Payer: Self-pay | Admitting: Anesthesiology

## 2011-08-09 ENCOUNTER — Ambulatory Visit (HOSPITAL_BASED_OUTPATIENT_CLINIC_OR_DEPARTMENT_OTHER): Payer: Medicare Other | Admitting: Anesthesiology

## 2011-08-09 ENCOUNTER — Other Ambulatory Visit (INDEPENDENT_AMBULATORY_CARE_PROVIDER_SITE_OTHER): Payer: Self-pay | Admitting: Surgery

## 2011-08-09 ENCOUNTER — Ambulatory Visit (HOSPITAL_BASED_OUTPATIENT_CLINIC_OR_DEPARTMENT_OTHER)
Admission: RE | Admit: 2011-08-09 | Discharge: 2011-08-09 | Disposition: A | Discharge status: Home / Self Care | Payer: Medicare Other | Source: Ambulatory Visit | Attending: Surgery | Admitting: Surgery

## 2011-08-09 ENCOUNTER — Encounter (HOSPITAL_BASED_OUTPATIENT_CLINIC_OR_DEPARTMENT_OTHER)
Admission: RE | Disposition: A | Discharge status: Home / Self Care | Payer: Self-pay | Source: Ambulatory Visit | Attending: Surgery

## 2011-08-09 ENCOUNTER — Encounter (HOSPITAL_BASED_OUTPATIENT_CLINIC_OR_DEPARTMENT_OTHER): Payer: Self-pay | Admitting: *Deleted

## 2011-08-09 ENCOUNTER — Other Ambulatory Visit: Payer: Self-pay

## 2011-08-09 DIAGNOSIS — R921 Mammographic calcification found on diagnostic imaging of breast: Secondary | ICD-10-CM

## 2011-08-09 DIAGNOSIS — I10 Essential (primary) hypertension: Secondary | ICD-10-CM | POA: Insufficient documentation

## 2011-08-09 DIAGNOSIS — K219 Gastro-esophageal reflux disease without esophagitis: Secondary | ICD-10-CM | POA: Insufficient documentation

## 2011-08-09 DIAGNOSIS — N6019 Diffuse cystic mastopathy of unspecified breast: Secondary | ICD-10-CM

## 2011-08-09 DIAGNOSIS — R920 Mammographic microcalcification found on diagnostic imaging of breast: Secondary | ICD-10-CM | POA: Insufficient documentation

## 2011-08-09 HISTORY — DX: Gastro-esophageal reflux disease without esophagitis: K21.9

## 2011-08-09 HISTORY — PX: BREAST BIOPSY: SHX20

## 2011-08-09 HISTORY — DX: Inflammatory liver disease, unspecified: K75.9

## 2011-08-09 SURGERY — BREAST BIOPSY WITH NEEDLE LOCALIZATION
Anesthesia: General | Site: Breast | Laterality: Left | Wound class: Clean

## 2011-08-09 MED ORDER — MIDAZOLAM HCL 5 MG/5ML IJ SOLN
INTRAMUSCULAR | Status: DC | PRN
  Administered 2011-08-09: 2 mg via INTRAVENOUS

## 2011-08-09 MED ORDER — FENTANYL CITRATE 0.05 MG/ML IJ SOLN: 25.0000 ug | INTRAMUSCULAR | Status: DC | PRN

## 2011-08-09 MED ORDER — CHLORHEXIDINE GLUCONATE 4 % EX LIQD: 1.0000 "application " | Freq: Once | CUTANEOUS | Status: DC

## 2011-08-09 MED ORDER — ONDANSETRON HCL 4 MG/2ML IJ SOLN
INTRAMUSCULAR | Status: DC | PRN
  Administered 2011-08-09: 4 mg via INTRAVENOUS

## 2011-08-09 MED ORDER — HYDROCODONE-ACETAMINOPHEN 5-325 MG PO TABS: 1.0000 | ORAL_TABLET | ORAL | Status: AC | PRN

## 2011-08-09 MED ORDER — METOCLOPRAMIDE HCL 5 MG/ML IJ SOLN: 10.0000 mg | Freq: Once | INTRAMUSCULAR | Status: DC | PRN

## 2011-08-09 MED ORDER — MORPHINE SULFATE 2 MG/ML IJ SOLN: 0.0500 mg/kg | INTRAMUSCULAR | Status: DC | PRN

## 2011-08-09 MED ORDER — BUPIVACAINE HCL (PF) 0.25 % IJ SOLN
INTRAMUSCULAR | Status: DC | PRN
  Administered 2011-08-09: 20 mL

## 2011-08-09 MED ORDER — FENTANYL CITRATE 0.05 MG/ML IJ SOLN: 50.0000 ug | INTRAMUSCULAR | Status: DC | PRN

## 2011-08-09 MED ORDER — LACTATED RINGERS IV SOLN
INTRAVENOUS | Status: DC
  Administered 2011-08-09 (×2): via INTRAVENOUS

## 2011-08-09 MED ORDER — PROPOFOL 10 MG/ML IV EMUL
INTRAVENOUS | Status: DC | PRN
  Administered 2011-08-09: 200 mg via INTRAVENOUS

## 2011-08-09 MED ORDER — FENTANYL CITRATE 0.05 MG/ML IJ SOLN
INTRAMUSCULAR | Status: DC | PRN
  Administered 2011-08-09: 25 ug via INTRAVENOUS
  Administered 2011-08-09: 50 ug via INTRAVENOUS

## 2011-08-09 MED ORDER — DEXAMETHASONE SODIUM PHOSPHATE 4 MG/ML IJ SOLN
INTRAMUSCULAR | Status: DC | PRN
  Administered 2011-08-09: 10 mg via INTRAVENOUS

## 2011-08-09 MED ORDER — LIDOCAINE HCL (CARDIAC) 20 MG/ML IV SOLN
INTRAVENOUS | Status: DC | PRN
  Administered 2011-08-09: 40 mg via INTRAVENOUS

## 2011-08-09 MED ORDER — DROPERIDOL 2.5 MG/ML IJ SOLN
INTRAMUSCULAR | Status: DC | PRN
  Administered 2011-08-09: 0.625 mg via INTRAVENOUS

## 2011-08-09 MED ORDER — MIDAZOLAM HCL 2 MG/2ML IJ SOLN: 0.5000 mg | INTRAMUSCULAR | Status: DC | PRN

## 2011-08-09 MED ORDER — 0.9 % SODIUM CHLORIDE (POUR BTL) OPTIME
TOPICAL | Status: DC | PRN
  Administered 2011-08-09: 100 mL

## 2011-08-09 MED ORDER — CEFAZOLIN SODIUM 1-5 GM-% IV SOLN
1.0000 g | INTRAVENOUS | Status: AC
  Administered 2011-08-09: 1 g via INTRAVENOUS

## 2011-08-09 SURGICAL SUPPLY — 47 items
ADH SKN CLS APL DERMABOND .7 (GAUZE/BANDAGES/DRESSINGS) ×1
APPLICATOR COTTON TIP 6IN STRL (MISCELLANEOUS) IMPLANT
BINDER BREAST LRG (GAUZE/BANDAGES/DRESSINGS) IMPLANT
BINDER BREAST MEDIUM (GAUZE/BANDAGES/DRESSINGS) IMPLANT
BINDER BREAST XLRG (GAUZE/BANDAGES/DRESSINGS) IMPLANT
BINDER BREAST XXLRG (GAUZE/BANDAGES/DRESSINGS) IMPLANT
BLADE HEX COATED 2.75 (ELECTRODE) ×2 IMPLANT
BLADE SURG 15 STRL LF DISP TIS (BLADE) ×1 IMPLANT
BLADE SURG 15 STRL SS (BLADE) ×2
CANISTER SUCTION 1200CC (MISCELLANEOUS) ×2 IMPLANT
CHLORAPREP W/TINT 26ML (MISCELLANEOUS) ×2 IMPLANT
CLIP TI MEDIUM 6 (CLIP) IMPLANT
CLIP TI WIDE RED SMALL 6 (CLIP) IMPLANT
CLOTH BEACON ORANGE TIMEOUT ST (SAFETY) ×2 IMPLANT
COVER MAYO STAND STRL (DRAPES) ×2 IMPLANT
COVER TABLE BACK 60X90 (DRAPES) ×2 IMPLANT
DECANTER SPIKE VIAL GLASS SM (MISCELLANEOUS) IMPLANT
DERMABOND ADVANCED (GAUZE/BANDAGES/DRESSINGS) ×1
DERMABOND ADVANCED .7 DNX12 (GAUZE/BANDAGES/DRESSINGS) ×1 IMPLANT
DEVICE DUBIN W/COMP PLATE 8390 (MISCELLANEOUS) IMPLANT
DRAPE LAPAROTOMY TRNSV 102X78 (DRAPE) ×2 IMPLANT
DRAPE UTILITY XL STRL (DRAPES) ×2 IMPLANT
ELECT REM PT RETURN 9FT ADLT (ELECTROSURGICAL) ×2
ELECTRODE REM PT RTRN 9FT ADLT (ELECTROSURGICAL) ×1 IMPLANT
GLOVE ECLIPSE 6.5 STRL STRAW (GLOVE) ×2 IMPLANT
GLOVE EUDERMIC 7 POWDERFREE (GLOVE) ×2 IMPLANT
GOWN PREVENTION PLUS XLARGE (GOWN DISPOSABLE) ×4 IMPLANT
KIT MARKER MARGIN INK (KITS) IMPLANT
NDL HYPO 25X1 1.5 SAFETY (NEEDLE) ×1 IMPLANT
NEEDLE HYPO 25X1 1.5 SAFETY (NEEDLE) ×2 IMPLANT
NS IRRIG 1000ML POUR BTL (IV SOLUTION) IMPLANT
PACK BASIN DAY SURGERY FS (CUSTOM PROCEDURE TRAY) ×2 IMPLANT
PENCIL BUTTON HOLSTER BLD 10FT (ELECTRODE) ×2 IMPLANT
SLEEVE SCD COMPRESS KNEE MED (MISCELLANEOUS) ×2 IMPLANT
SPONGE GAUZE 4X4 12PLY (GAUZE/BANDAGES/DRESSINGS) ×1 IMPLANT
SPONGE INTESTINAL PEANUT (DISPOSABLE) IMPLANT
SPONGE LAP 4X18 X RAY DECT (DISPOSABLE) ×2 IMPLANT
STAPLER VISISTAT 35W (STAPLE) IMPLANT
SUT MNCRL AB 4-0 PS2 18 (SUTURE) ×2 IMPLANT
SUT SILK 0 TIES 10X30 (SUTURE) IMPLANT
SUT VICRYL 3-0 CR8 SH (SUTURE) ×2 IMPLANT
SYR CONTROL 10ML LL (SYRINGE) ×2 IMPLANT
TAPE CLOTH SURG 4X10 WHT LF (GAUZE/BANDAGES/DRESSINGS) ×1 IMPLANT
TOWEL OR NON WOVEN STRL DISP B (DISPOSABLE) ×2 IMPLANT
TUBE CONNECTING 20X1/4 (TUBING) ×2 IMPLANT
WATER STERILE IRR 1000ML POUR (IV SOLUTION) ×2 IMPLANT
YANKAUER SUCT BULB TIP NO VENT (SUCTIONS) ×2 IMPLANT

## 2011-08-09 NOTE — Anesthesia Preprocedure Evaluation (Signed)
Anesthesia Evaluation  Patient identified by MRN, date of birth, ID band Patient awake    Reviewed: Allergy & Precautions, H&P , NPO status , Patient's Chart, lab work & pertinent test results, reviewed documented beta blocker date and time   Airway Mallampati: II TM Distance: >3 FB Neck ROM: full    Dental   Pulmonary neg pulmonary ROS,          Cardiovascular hypertension, Pt. on medications and On Medications     Neuro/Psych Negative Neurological ROS  Negative Psych ROS   GI/Hepatic negative GI ROS, GERD-  Medicated and Controlled,(+) Hepatitis -, Unspecified  Endo/Other  Negative Endocrine ROS  Renal/GU negative Renal ROS  Genitourinary negative   Musculoskeletal   Abdominal   Peds  Hematology negative hematology ROS (+)   Anesthesia Other Findings See surgeon's H&P   Reproductive/Obstetrics negative OB ROS                           Anesthesia Physical Anesthesia Plan  ASA: II  Anesthesia Plan: General   Post-op Pain Management:    Induction: Intravenous  Airway Management Planned: LMA  Additional Equipment:   Intra-op Plan:   Post-operative Plan: Extubation in OR  Informed Consent: I have reviewed the patients History and Physical, chart, labs and discussed the procedure including the risks, benefits and alternatives for the proposed anesthesia with the patient or authorized representative who has indicated his/her understanding and acceptance.     Plan Discussed with: CRNA and Surgeon  Anesthesia Plan Comments:         Anesthesia Quick Evaluation

## 2011-08-09 NOTE — Transfer of Care (Signed)
Immediate Anesthesia Transfer of Care Note  Patient: Nicole Robbins  Procedure(s) Performed:  BREAST BIOPSY WITH NEEDLE LOCALIZATION - needle localized at breast center of  left breast biopsy   Patient Location: PACU  Anesthesia Type: General  Level of Consciousness: awake  Airway & Oxygen Therapy: Patient Spontanous Breathing and Patient connected to face mask oxygen  Post-op Assessment: Report given to PACU RN and Post -op Vital signs reviewed and stable  Post vital signs: Reviewed and stable  Complications: No apparent anesthesia complications

## 2011-08-09 NOTE — Anesthesia Postprocedure Evaluation (Signed)
Anesthesia Post Note  Patient: Nicole Robbins  Procedure(s) Performed:  BREAST BIOPSY WITH NEEDLE LOCALIZATION - needle localized at breast center of [place] left breast biopsy   Anesthesia type: General  Patient location: PACU  Post pain: Pain level controlled  Post assessment: Patient's Cardiovascular Status Stable  Last Vitals:  Filed Vitals:   08/09/11 1145  BP: 129/88  Pulse: 67  Temp:   Resp: 23    Post vital signs: Reviewed and stable  Level of consciousness: alert  Complications: No apparent anesthesia complications

## 2011-08-09 NOTE — Interval H&P Note (Signed)
History and Physical Interval Note:  08/09/2011 10:00 AM  Nicole Robbins  has presented today for surgery, with the diagnosis of left breast calcifications   The various methods of treatment have been discussed with the patient and family. After consideration of risks, benefits and other options for treatment, the patient has consented to  Procedure(s): BREAST BIOPSY WITH NEEDLE LOCALIZATION as a surgical intervention .  The patients' history has been reviewed, patient examined, no change in status, stable for surgery.  I have reviewed the patients' chart and labs.  Questions were answered to the patient's satisfaction.     [name]

## 2011-08-09 NOTE — H&P (View-Only) (Signed)
  CC: Left breast calcifications HPI: This patient recently had routine mammograms. Some moderately suspicious left breast calcifications were noted. They are not amenable to a core biopsy.  The patient has no breast symptoms. She's never had any prior breast problems. She has a negative family history for breast cancer.   ROS: Essentially negative. The patient works actively and consider herself in excellent health  MEDS: No current outpatient prescriptions on file.      ALLERGIES:  Allergies  Allergen Reactions  . Tetracyclines & Related Nausea Only and Other (See Comments)    Made stomach cramp.    PE GENERAL:  The patient is alert, oriented, and generally healthy-appearing, NAD. Mood and affect are normal.  HEENT:  The head is normocephalic, the eyes nonicteric, the pupils were round regular and equal. EOMs are normal. Pharynx normal. Dentition good.  NECK:  The neck is supple and there are no masses or thyromegaly.  LUNGS: Normal respirations and clear to auscultation.  HEART: Regular rhythm, with no murmurs rubs or gallops. Pulses are intact carotid dorsalis pedis and posterior tibial. No significant varicosities are noted.  BREASTS: Small, without mass or other abnormality  ABDOMEN: Soft, flat, and nontender. No masses or organomegaly is noted. No hernias are noted. Bowel sounds are normal.  EXTREMITIES:  Good range of motion, no edema.   Data Reviewed I have reviewed over the mammogram reports. There are some moderately suspicious calcifications noted  Assessment Moderately suspicious calcifications left breast upper outer quadrant  Plan Needle localized excisional biopsy 

## 2011-08-09 NOTE — Op Note (Signed)
Nicole Robbins  [birth year]  [id number]  08/09/2011   Preoperative diagnosis: Suspicious calcifications left breast upper outer quadrant  Postoperative diagnosis: Same  Procedure: Excision left breast calcifications upper outer quadrant guidewire localized  Surgeon: Currie Paris, MD, FACS  Anesthesia: General  Clinical History and Indications: this patient presents for a guidewire localized excision of a Left breast Area of suspicious calcification.  Description of procedure: The patient was seen in the holding area and the plans for the procedure reviewed. The Left breast was marked as the operative side. The wire localizing films were reviewed.  The patient was taken to the operating room and after satisfactory general anesthesia had been obtained the Left breast was prepped and draped and the timeout was performed.  The incision was made over the presumed area of the mass. Skin flaps were raised and using cautery the area was completely excised.The guidewire entered at the lateral to the pectoralis and tracked somewhat inferiorly. The incision was made about 2 cm below the guidewire skin entry site. I made very thin skin flaps superiorly to mobilize the wire in and inferiorly. The patient's very thin and I basically took out full thickness breast tissue including fascia deep. This was only about 1.5 cm thick and was at the very edge of the pectoralis muscle.   Bleeders were controlled with either cautery or sutures as needed.  After achieving hemostasis, the incision was Irrigated and then closed with 3-0 Vicryl, 4-0 Monocryl subcuticular, and Dermabond. The patient tolerated the procedure well. There were no operative complications. All counts were correct.   EBL: Minimal  Currie Paris, MD, FACS 08/09/2011 10:54 AM

## 2011-08-09 NOTE — Anesthesia Procedure Notes (Signed)
Procedure Name: LMA Insertion Date/Time: 08/09/2011 10:22 AM Performed by: Jearld Shines Pre-anesthesia Checklist: Patient identified, Emergency Drugs available, Suction available and Patient being monitored Patient Re-evaluated:Patient Re-evaluated prior to inductionOxygen Delivery Method: Circle System Utilized Preoxygenation: Pre-oxygenation with 100% oxygen Intubation Type: IV induction Ventilation: Mask ventilation without difficulty LMA: LMA inserted LMA Size: 4.0 Number of attempts: 1 Airway Equipment and Method: bite block Placement Confirmation: positive ETCO2 Tube secured with: Tape Dental Injury: Teeth and Oropharynx as per pre-operative assessment

## 2011-08-10 ENCOUNTER — Telehealth (INDEPENDENT_AMBULATORY_CARE_PROVIDER_SITE_OTHER): Payer: Self-pay | Admitting: General Surgery

## 2011-08-10 NOTE — Telephone Encounter (Signed)
Left message for patient to call me back. 

## 2011-08-10 NOTE — Telephone Encounter (Signed)
Patient made aware of path results. Will follow up at appt and call with any questions prior.  

## 2011-08-10 NOTE — Telephone Encounter (Signed)
Message copied by Liliana Cline on Tue Aug 10, 2011 12:28 PM ------      Message from: Currie Paris      Created: Tue Aug 10, 2011 11:00 AM       Tell her path is benign and as expected

## 2011-08-11 ENCOUNTER — Encounter (HOSPITAL_BASED_OUTPATIENT_CLINIC_OR_DEPARTMENT_OTHER): Payer: Self-pay | Admitting: Surgery

## 2011-08-27 ENCOUNTER — Ambulatory Visit (INDEPENDENT_AMBULATORY_CARE_PROVIDER_SITE_OTHER): Payer: Medicare Other | Admitting: Surgery

## 2011-08-27 ENCOUNTER — Encounter (INDEPENDENT_AMBULATORY_CARE_PROVIDER_SITE_OTHER): Payer: Self-pay | Admitting: Surgery

## 2011-08-27 VITALS — BP 142/86 | HR 68 | Temp 97.1°F | Resp 18 | Ht 63.0 in | Wt 115.6 lb

## 2011-08-27 DIAGNOSIS — Z9889 Other specified postprocedural states: Secondary | ICD-10-CM

## 2011-08-27 DIAGNOSIS — R921 Mammographic calcification found on diagnostic imaging of breast: Secondary | ICD-10-CM

## 2011-08-27 DIAGNOSIS — R928 Other abnormal and inconclusive findings on diagnostic imaging of breast: Secondary | ICD-10-CM

## 2011-08-27 NOTE — Progress Notes (Signed)
NAME: Nicole Robbins                                            DOB: [birth year] DATE: 08/27/2011                                                  MRN: [id number]  CC: Post op   HPI: This patient comes in for post op follow-up.Sheunderwent NL excision of calcs  on 08/09/11. She feels that she is doing well.  PE: General: The patient appears to be healthy, NAD Incision healing no infection or hematoma  DATA REVIEWED: Path benign F/c  IMPRESSION: The patient is doing well S/P  Excisional biopsy left breast.    PLAN: RTC PRN However, will check f/u mammogram to be sure all calcs removed

## 2011-09-16 ENCOUNTER — Ambulatory Visit
Admission: RE | Admit: 2011-09-16 | Discharge: 2011-09-16 | Disposition: A | Discharge status: Home / Self Care | Payer: Medicare Other | Source: Ambulatory Visit | Attending: Surgery | Admitting: Surgery

## 2011-09-16 DIAGNOSIS — R921 Mammographic calcification found on diagnostic imaging of breast: Secondary | ICD-10-CM

## 2012-02-07 ENCOUNTER — Other Ambulatory Visit (INDEPENDENT_AMBULATORY_CARE_PROVIDER_SITE_OTHER): Payer: Self-pay | Admitting: Surgery

## 2012-02-07 DIAGNOSIS — R921 Mammographic calcification found on diagnostic imaging of breast: Secondary | ICD-10-CM

## 2012-03-14 ENCOUNTER — Ambulatory Visit
Admission: RE | Admit: 2012-03-14 | Discharge: 2012-03-14 | Disposition: A | Discharge status: Home / Self Care | Payer: Medicare Other | Source: Ambulatory Visit | Attending: Surgery | Admitting: Surgery

## 2012-03-14 DIAGNOSIS — R921 Mammographic calcification found on diagnostic imaging of breast: Secondary | ICD-10-CM

## 2012-04-13 ENCOUNTER — Encounter: Payer: Self-pay | Admitting: Gastroenterology

## 2012-05-05 ENCOUNTER — Encounter: Payer: Self-pay | Admitting: Gastroenterology

## 2012-05-05 ENCOUNTER — Ambulatory Visit (INDEPENDENT_AMBULATORY_CARE_PROVIDER_SITE_OTHER): Payer: Medicare Other | Admitting: Gastroenterology

## 2012-05-05 ENCOUNTER — Telehealth: Payer: Self-pay | Admitting: Gastroenterology

## 2012-05-05 VITALS — BP 100/58 | HR 64 | Ht 61.75 in | Wt 110.2 lb

## 2012-05-05 DIAGNOSIS — K449 Diaphragmatic hernia without obstruction or gangrene: Secondary | ICD-10-CM

## 2012-05-05 DIAGNOSIS — D649 Anemia, unspecified: Secondary | ICD-10-CM

## 2012-05-05 DIAGNOSIS — D259 Leiomyoma of uterus, unspecified: Secondary | ICD-10-CM

## 2012-05-05 DIAGNOSIS — R195 Other fecal abnormalities: Secondary | ICD-10-CM

## 2012-05-05 DIAGNOSIS — D219 Benign neoplasm of connective and other soft tissue, unspecified: Secondary | ICD-10-CM

## 2012-05-05 MED ORDER — PEG-KCL-NACL-NASULF-NA ASC-C 100 G PO SOLR: 1.0000 | Freq: Once | ORAL | Status: DC

## 2012-05-05 NOTE — Telephone Encounter (Signed)
Caller: Katheen/Patient; Patient Name: Nicole Robbins, Nicole Robbins; NUU:[id number], [name]; Best Callback Phone Number: [phone]. Patient states she was by Dr. Jarold Motto today for Blood in her stool and chronic constipation. He advised her to use Miralax -Start taking Miralax over the counter mixing 17 grams in 8 oz of water daily for constipation.  She states it was $16.00 in the pharmacy and she can get it cheaper if it is written as a prescription.  Patient would like a prescription for medication.  She uses [facility] [phone]. PLEASE CONTACT PATIENT.

## 2012-05-05 NOTE — Progress Notes (Signed)
History of Present Illness:  This is a very nice 66 year old African American female referred for evaluation of asymptomatic guaiac positive stools on Hemoccult testing. This patient denies any GI complaints at this time except for chronic functional constipation with laxative dependency. She denies hematochezia even with straining. Recent labs have shown normal CBC except for hemoglobin 11.5 with normal iron, B12, folate, and metabolic profiles.  This patient was hospitalized in July 2007 with abdominal pain and a large left hepatic lobe abscess. These records were assessed and reviewed as were her radiographs and laboratory parameters. Endoscopy reports requested. This was drained percutaneously by radiology, and apparently had no problems after this hospitalization. She was anemic at the time of her hospitalization and underwent endoscopy and colonoscopy by Dr. Ewing Schlein , both of which were apparently normal except for some erosive esophagitis. It is unclear if she had followup with Dr. Ewing Schlein after that hospitalization. Followup CT scan on reviewing her radiograph did show healing of her hepatic abscess. During this hospitalization, she did receive blood transfusions along with intravenous antibiotics. She does have uterine fibroids noted per CT scan, but denies any gynecologic problems or menorrhagia at this time. 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 Also noted was a large hiatal hernia. Patient denies any history of known liver disease, jaundice, clay colored stools, mental status changes, or history of alcohol or IV drug abuse. Her appetite is good and she denies any food intolerances. She denies acid reflux, dysphagia, or history of hepatitis or pancreatitis.  I have reviewed this patient's present history, medical and surgical past history, allergies and medications.     ROS: The remainder of the 10 point ROS is negative... she is on antihypertensive medications but denies a  current cardiovascular or pulmonary complaints.     Physical Exam: Blood pressure 100/58, pulse 64, weight 110 with a BMI of 20.33. General well developed well nourished patient in no acute distress, appearing her stated age Eyes PERRLA, no icterus, fundoscopic exam per opthamologist Skin no lesions noted Neck supple, no adenopathy, no thyroid enlargement, no tenderness Chest clear to percussion and auscultation Heart no significant murmurs, gallops or rubs noted Abdomen no hepatosplenomegaly masses or tenderness, BS normal.  Rectal inspection normal no fissures, or fistulae noted.  No masses or tenderness on digital exam. Stool guaiac negative. Extremities no acute joint lesions, edema, phlebitis or evidence of cellulitis. Neurologic patient oriented x 3, cranial nerves intact, no focal neurologic deficits noted. Psychological mental status normal and normal affect.  Assessment and plan: Chronic functional constipation in a 66 year old patient status post drainage of a liver abscess 6 years ago with associated anemia at that time, but negative endoscopy and colonoscopy. Patient currently is asymptomatic in terms of any GI complaints, and on my exam has a guaiac negative stool. She does have a mild normochromic anemia. I've scheduled her for colonoscopy and endoscopy followup. Also because of her constipation I placed her on MiraLax 8 ounces on regular basis at bedtime. Recent breast biopsies by Dr. Jamey Ripa consistent with fibrocystic breast disease Encounter Diagnosis  Name Primary?  . Hematest positive stools Yes

## 2012-05-05 NOTE — Patient Instructions (Addendum)
You have been scheduled for an endoscopy and colonoscopy with propofol. Please follow the written instructions given to you at your visit today. Please pick up your prep at the pharmacy within the next 1-3 days. If you use inhalers (even only as needed) or a CPAP machine, please bring them with you on the day of your procedure.  Start taking Miralax over the counter mixing 17 grams in 8 oz of water daily for constipation.

## 2012-05-05 NOTE — Telephone Encounter (Signed)
ok 

## 2012-05-15 ENCOUNTER — Other Ambulatory Visit: Payer: Self-pay | Admitting: *Deleted

## 2012-05-15 ENCOUNTER — Ambulatory Visit (AMBULATORY_SURGERY_CENTER): Payer: Medicare Other | Admitting: Gastroenterology

## 2012-05-15 ENCOUNTER — Encounter: Payer: Self-pay | Admitting: Gastroenterology

## 2012-05-15 VITALS — BP 117/78 | HR 56 | Temp 98.1°F | Resp 12 | Ht 62.0 in | Wt 110.0 lb

## 2012-05-15 DIAGNOSIS — K449 Diaphragmatic hernia without obstruction or gangrene: Secondary | ICD-10-CM

## 2012-05-15 DIAGNOSIS — R195 Other fecal abnormalities: Secondary | ICD-10-CM

## 2012-05-15 DIAGNOSIS — K219 Gastro-esophageal reflux disease without esophagitis: Secondary | ICD-10-CM

## 2012-05-15 DIAGNOSIS — K2901 Acute gastritis with bleeding: Secondary | ICD-10-CM

## 2012-05-15 DIAGNOSIS — Z1211 Encounter for screening for malignant neoplasm of colon: Secondary | ICD-10-CM

## 2012-05-15 MED ORDER — PANTOPRAZOLE SODIUM 40 MG PO TBEC: DELAYED_RELEASE_TABLET | ORAL | Status: DC

## 2012-05-15 MED ORDER — SODIUM CHLORIDE 0.9 % IV SOLN: 500.0000 mL | INTRAVENOUS | Status: DC

## 2012-05-15 NOTE — Progress Notes (Signed)
Clo test done and sent.

## 2012-05-15 NOTE — Patient Instructions (Addendum)
YOU HAD AN ENDOSCOPIC PROCEDURE TODAY AT THE [facility]: Refer to the procedure report that was given to you for any specific questions about what was found during the examination.  If the procedure report does not answer your questions, please call your gastroenterologist to clarify.  If you requested that your care partner not be given the details of your procedure findings, then the procedure report has been included in a sealed envelope for you to review at your convenience later.  YOU SHOULD EXPECT: Some feelings of bloating in the abdomen. Passage of more gas than usual.  Walking can help get rid of the air that was put into your GI tract during the procedure and reduce the bloating. If you had a lower endoscopy (such as a colonoscopy or flexible sigmoidoscopy) you may notice spotting of blood in your stool or on the toilet paper. If you underwent a bowel prep for your procedure, then you may not have a normal bowel movement for a few days.  DIET: Your first meal following the procedure should be a light meal and then it is ok to progress to your normal diet.  A half-sandwich or bowl of soup is an example of a good first meal.  Heavy or fried foods are harder to digest and may make you feel nauseous or bloated.  Likewise meals heavy in dairy and vegetables can cause extra gas to form and this can also increase the bloating.  Drink plenty of fluids but you should avoid alcoholic beverages for 24 hours.  ACTIVITY: Your care partner should take you home directly after the procedure.  You should plan to take it easy, moving slowly for the rest of the day.  You can resume normal activity the day after the procedure however you should NOT DRIVE or use heavy machinery for 24 hours (because of the sedation medicines used during the test).    SYMPTOMS TO REPORT IMMEDIATELY: A gastroenterologist can be reached at any hour.  During normal business hours, 8:30 AM to 5:00 PM Monday through Friday,  call [phone].  After hours and on weekends, please call the GI answering service at [phone] who will take a message and have the physician on call contact you.   Following lower endoscopy (colonoscopy or flexible sigmoidoscopy):  Excessive amounts of blood in the stool  Significant tenderness or worsening of abdominal pains  Swelling of the abdomen that is new, acute  Fever of 100F or higher  Following upper endoscopy (EGD)  Vomiting of blood or coffee ground material  New chest pain or pain under the shoulder blades  Painful or persistently difficult swallowing  New shortness of breath  Fever of 100F or higher  Black, tarry-looking stools  FOLLOW UP: If any biopsies were taken you will be contacted by phone or by letter within the next 1-3 weeks.  Call your gastroenterologist if you have not heard about the biopsies in 3 weeks.  Our staff will call the home number listed on your records the next business day following your procedure to check on you and address any questions or concerns that you may have at that time regarding the information given to you following your procedure. This is a courtesy call and so if there is no answer at the home number and we have not heard from you through the emergency physician on call, we will assume that you have returned to your regular daily activities without incident.  SIGNATURES/CONFIDENTIALITY: You and/or your care   partner have signed paperwork which will be entered into your electronic medical record.  These signatures attest to the fact that that the information above on your After Visit Summary has been reviewed and is understood.  Full responsibility of the confidentiality of this discharge information lies with you and/or your care-partner.    Normal colonoscopy,  Next colonoscopy should be done in 10 years-2023.  Use Miralax as needed.  Esophagitis, stricture, hiatal hernia and GERD information given.  Don use any  nonsteroidal anti inflammatories or aspirin for two weeks. If CLO test is positive, Dr. Jarold Motto will order you more medication.  Dr. Norval Gable nurse will send and order to your pharmacy for new medication.  Take daily as directed.

## 2012-05-15 NOTE — Progress Notes (Signed)
Patient did not experience any of the following events: a burn prior to discharge; a fall within the facility; wrong site/side/patient/procedure/implant event; or a hospital transfer or hospital admission upon discharge from the facility. (G8907) Patient did not have preoperative order for IV antibiotic SSI prophylaxis. (G8918)  

## 2012-05-15 NOTE — Op Note (Signed)
[facility] [address] [place], [address]   ENDOSCOPY PROCEDURE REPORT  PATIENT: Nicole Robbins, Nicole Robbins  MR#: [id number] BIRTHDATE: Jan 31, 1946 , 66  yrs. old GENDER: Female ENDOSCOPIST: REFERRED BY: Lucky Cowboy, M.D. PROCEDURE DATE:  05/15/2012 PROCEDURE:   EGD w/ biopsy for H.pylori ASA CLASS:    Class II INDICATIONS: heme positive stool. MEDICATION: There was residual sedation effect present from prior procedure and propofol (Diprivan) 250mg  IV TOPICAL ANESTHETIC:  DESCRIPTION OF PROCEDURE:   After the risks and benefits of the procedure were explained, informed consent was obtained.  The LB GIF-H180 [id number]  endoscope was introduced through the mouth  and advanced to the second portion of the duodenum .  The instrument was slowly withdrawn as the mucosa was fully examined.     DUODENUM: NORMAL,NO EROSIONS OR ULCERS  STOMACH: There was mild antral gastropathy noted.CLO BX. DONE. Retroflexed views revealed a hiatal hernia.    The scope was then withdrawn from the patient and the procedure completed.MULTIPLE ANTRAL EROSIONS AND DRIED HEME !!!  ESOPHAGUS; SEVERE ESOPHAGITIS IN DISTAL 1/3 OF ESOPHAGUS WITH TIGHT STRICTURE DILATED WITH THE SCOPE..SEE PICTURES  COMPLICATIONS: There were no complications.   ENDOSCOPIC IMPRESSION: 1.   There was SEVERE antral gastropathy noted .Marland KitchenR/O H.PYLORI THIS IS SOURCE OF GUAIAC ++ STOOLS. 2.   Esophagitis.Marland KitchenSEVERE FROM LARGE HH AND GERD 3.    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  HH NOTED.  DILATED WITH ENDOSCOPE.  RECOMMENDATIONS: 1.  Avoid NSAIDS for two weeks 2.  await pathology results 3.  Rx CLO if positive 4. DAILY PPI   _______________________________ eSigned:  Mardella Layman, MD, [facility] 05/15/2012 3:57 PM   antireflux   PATIENT NAME:  Nicole Robbins, Nicole Robbins MR#: [id number]

## 2012-05-15 NOTE — Op Note (Signed)
[facility] [address] [place], [address]   COLONOSCOPY PROCEDURE REPORT  PATIENT: Nicole Robbins, Nicole Robbins  MR#: [id number] BIRTHDATE: 1945/11/16 , 66  yrs. old GENDER: Female ENDOSCOPIST: Mardella Layman, MD, [facility] REFERRED BY: PROCEDURE DATE:  05/15/2012 PROCEDURE:   Colonoscopy, diagnostic ASA CLASS:   Class II INDICATIONS:average risk patient for colon cancer and heme-positive stool. MEDICATIONS: propofol (Diprivan) 250mg  IV  DESCRIPTION OF PROCEDURE:   After the risks and benefits and of the procedure were explained, informed consent was obtained.  A digital rectal exam revealed no abnormalities of the rectum.    The LB [id number]-[id number]  endoscope was introduced through the anus and advanced to the cecum, which was identified by both the appendix and ileocecal valve .  The quality of the prep was adequate, using MoviPrep .  The instrument was then slowly withdrawn as the colon was fully examined.     COLON FINDINGS: A normal appearing cecum, ileocecal valve, and appendiceal orifice were identified.  The ascending, hepatic flexure, transverse, splenic flexure, descending, sigmoid colon and rectum appeared unremarkable.  No polyps or cancers were seen. External pressure was applied.   Very redundant and tortuous colon. Retroflexed views revealed no abnormalities.     The scope was then withdrawn from the patient and the procedure completed.  COMPLICATIONS: There were no complications. ENDOSCOPIC IMPRESSION: 1.   Normal colon; External pressure was applied per redundant colon !!! 2.   Very redundant and tortuous colon... 3.  No heme noted or source of guaiac ++ stools. RECOMMENDATIONS: 1.  Continue current medications 2.  Upper endoscopy will be scheduled 3.  Continue current colorectal screening recommendations for "routine risk" patients with a repeat colonoscopy in 10 years. 4.   prn Miralax  REPEAT  EXAM:  cc:  _______________________________ eSignedMardella Layman, MD, [facility] 05/15/2012 3:45 PM

## 2012-05-16 ENCOUNTER — Telehealth: Payer: Self-pay | Admitting: *Deleted

## 2012-05-16 NOTE — Telephone Encounter (Signed)
Message left

## 2012-05-19 ENCOUNTER — Encounter: Payer: Self-pay | Admitting: Gastroenterology

## 2012-05-19 LAB — HELICOBACTER PYLORI SCREEN-BIOPSY: UREASE: NEGATIVE

## 2012-06-06 ENCOUNTER — Other Ambulatory Visit (INDEPENDENT_AMBULATORY_CARE_PROVIDER_SITE_OTHER): Payer: Self-pay | Admitting: Surgery

## 2012-06-06 DIAGNOSIS — R921 Mammographic calcification found on diagnostic imaging of breast: Secondary | ICD-10-CM

## 2012-07-13 ENCOUNTER — Ambulatory Visit
Admission: RE | Admit: 2012-07-13 | Discharge: 2012-07-13 | Disposition: A | Discharge status: Home / Self Care | Payer: Medicare Other | Source: Ambulatory Visit | Attending: Surgery | Admitting: Surgery

## 2012-07-13 DIAGNOSIS — R921 Mammographic calcification found on diagnostic imaging of breast: Secondary | ICD-10-CM

## 2012-09-07 ENCOUNTER — Other Ambulatory Visit: Payer: Self-pay | Admitting: Gastroenterology

## 2012-12-07 ENCOUNTER — Other Ambulatory Visit (INDEPENDENT_AMBULATORY_CARE_PROVIDER_SITE_OTHER): Payer: Self-pay | Admitting: Surgery

## 2012-12-07 DIAGNOSIS — R921 Mammographic calcification found on diagnostic imaging of breast: Secondary | ICD-10-CM

## 2012-12-31 ENCOUNTER — Other Ambulatory Visit: Payer: Self-pay | Admitting: Gastroenterology

## 2013-01-01 ENCOUNTER — Other Ambulatory Visit: Payer: Self-pay | Admitting: *Deleted

## 2013-01-01 MED ORDER — PANTOPRAZOLE SODIUM 40 MG PO TBEC: 40.0000 mg | DELAYED_RELEASE_TABLET | Freq: Every day | ORAL | Status: DC

## 2013-01-01 NOTE — Telephone Encounter (Signed)
Denied refill by error [employer]

## 2013-01-11 ENCOUNTER — Ambulatory Visit
Admission: RE | Admit: 2013-01-11 | Discharge: 2013-01-11 | Disposition: A | Discharge status: Home / Self Care | Payer: Medicare Other | Source: Ambulatory Visit | Attending: Surgery | Admitting: Surgery

## 2013-01-11 DIAGNOSIS — R921 Mammographic calcification found on diagnostic imaging of breast: Secondary | ICD-10-CM

## 2013-04-30 ENCOUNTER — Other Ambulatory Visit: Payer: Self-pay | Admitting: Gastroenterology

## 2013-04-30 NOTE — Telephone Encounter (Signed)
PATIENT WILL NEED AN OFFICE VISIT FOR FURTHER REFILLS  

## 2013-06-05 ENCOUNTER — Other Ambulatory Visit (INDEPENDENT_AMBULATORY_CARE_PROVIDER_SITE_OTHER): Payer: Self-pay | Admitting: Surgery

## 2013-06-05 DIAGNOSIS — R921 Mammographic calcification found on diagnostic imaging of breast: Secondary | ICD-10-CM

## 2013-06-16 ENCOUNTER — Other Ambulatory Visit: Payer: Self-pay | Admitting: Internal Medicine

## 2013-06-27 ENCOUNTER — Other Ambulatory Visit: Payer: Self-pay | Admitting: Gastroenterology

## 2013-07-16 ENCOUNTER — Ambulatory Visit
Admission: RE | Admit: 2013-07-16 | Discharge: 2013-07-16 | Disposition: A | Discharge status: Home / Self Care | Payer: Medicare HMO | Source: Ambulatory Visit | Attending: Surgery | Admitting: Surgery

## 2013-07-16 DIAGNOSIS — R921 Mammographic calcification found on diagnostic imaging of breast: Secondary | ICD-10-CM

## 2013-08-01 ENCOUNTER — Other Ambulatory Visit: Payer: Self-pay | Admitting: Emergency Medicine

## 2013-08-08 ENCOUNTER — Other Ambulatory Visit: Payer: Self-pay | Admitting: Internal Medicine

## 2013-08-27 ENCOUNTER — Ambulatory Visit: Payer: Self-pay | Admitting: Internal Medicine

## 2013-08-30 ENCOUNTER — Ambulatory Visit: Payer: Self-pay | Admitting: Internal Medicine

## 2013-09-11 ENCOUNTER — Encounter: Payer: Self-pay | Admitting: Family Medicine

## 2013-09-11 ENCOUNTER — Ambulatory Visit (INDEPENDENT_AMBULATORY_CARE_PROVIDER_SITE_OTHER): Payer: Medicare HMO | Admitting: Family Medicine

## 2013-09-11 ENCOUNTER — Ambulatory Visit (HOSPITAL_COMMUNITY)
Admission: RE | Admit: 2013-09-11 | Discharge: 2013-09-11 | Disposition: A | Discharge status: Home / Self Care | Payer: Medicare HMO | Source: Ambulatory Visit | Attending: Internal Medicine | Admitting: Internal Medicine

## 2013-09-11 VITALS — BP 114/69 | HR 65 | Temp 98.1°F | Wt 112.0 lb

## 2013-09-11 DIAGNOSIS — I10 Essential (primary) hypertension: Secondary | ICD-10-CM | POA: Diagnosis not present

## 2013-09-11 DIAGNOSIS — I2510 Atherosclerotic heart disease of native coronary artery without angina pectoris: Secondary | ICD-10-CM

## 2013-09-11 DIAGNOSIS — B009 Herpesviral infection, unspecified: Secondary | ICD-10-CM

## 2013-09-11 DIAGNOSIS — I498 Other specified cardiac arrhythmias: Secondary | ICD-10-CM | POA: Insufficient documentation

## 2013-09-11 DIAGNOSIS — I499 Cardiac arrhythmia, unspecified: Secondary | ICD-10-CM

## 2013-09-11 DIAGNOSIS — B001 Herpesviral vesicular dermatitis: Secondary | ICD-10-CM | POA: Insufficient documentation

## 2013-09-11 MED ORDER — VALACYCLOVIR HCL 1 G PO TABS: 2000.0000 mg | ORAL_TABLET | Freq: Two times a day (BID) | ORAL | Status: DC

## 2013-09-11 MED ORDER — LISINOPRIL-HYDROCHLOROTHIAZIDE 20-25 MG PO TABS: 1.0000 | ORAL_TABLET | Freq: Every day | ORAL | Status: DC

## 2013-09-11 NOTE — Patient Instructions (Signed)
Thank you for coming in today.  For your hypertension I have refilled your blood pressure medicine.  For your fever blisters, I sent in a pill that you will take for the next two days to help them clear up faster.  Thank you!

## 2013-09-12 DIAGNOSIS — I2510 Atherosclerotic heart disease of native coronary artery without angina pectoris: Secondary | ICD-10-CM | POA: Insufficient documentation

## 2013-09-12 NOTE — Assessment & Plan Note (Signed)
Herpes labialis, painful, started yesterday - prescribed valtrex - counseled about pathophys as patient believed they were caused by being dirty

## 2013-09-12 NOTE — Assessment & Plan Note (Signed)
EKG obtained for irregular rhythm on exam showed NSR with evidence of previous inferior MI, patient denies any past chest pain - plan to discuss possible echo and/or stress test/cards referral to determine need for secondary prevention

## 2013-09-12 NOTE — Progress Notes (Signed)
   Subjective:    Patient ID: Nicole Robbins, female    DOB: [birth year], 68 y.o.   MRN: [id number]  HPI CHRONIC HYPERTENSION  Disease Monitoring  Blood pressure range: 110s-120s/60s-70s  Chest pain: no   Dyspnea: no   Claudication: no   Medication compliance: yes  Medication Side Effects  Lightheadedness: no   Urinary frequency: no   Edema: no   Impotence: no   Preventitive Healthcare:  Exercise: yes, very active   Diet Pattern: admirable  Salt Restriction: yes   Review of Systems  Constitutional: Negative for activity change, fatigue and unexpected weight change.  Respiratory: Negative for shortness of breath.   Cardiovascular: Negative for chest pain, palpitations and leg swelling.  Gastrointestinal: Negative for nausea, abdominal pain and diarrhea.  Neurological: Negative for dizziness, light-headedness and headaches.  All other systems reviewed and are negative.       Objective:   Physical Exam  Nursing note and vitals reviewed. Constitutional: She is oriented to person, place, and time. She appears well-developed and well-nourished. No distress.  HENT:  Head: Normocephalic and atraumatic.  Eyes: Conjunctivae are normal. Right eye exhibits no discharge. Left eye exhibits no discharge. No scleral icterus.  Cardiovascular: Normal rate and intact distal pulses.   No murmur heard. Irregularly irregular rhythm  Pulmonary/Chest: Effort normal and breath sounds normal. No respiratory distress. She has no wheezes.  Abdominal: Soft. Bowel sounds are normal. She exhibits no distension and no mass. There is no tenderness. There is no rebound and no guarding.  Neurological: She is alert and oriented to person, place, and time.  Skin: Skin is warm and dry. She is not diaphoretic.  Psychiatric: She has a normal mood and affect. Her behavior is normal.    EKG: NSR, Q-waves in III and aVF      Assessment & Plan:

## 2013-09-12 NOTE — Assessment & Plan Note (Addendum)
Well controlled on current therapy, reports normal at home and drug store, 114/69 today - refilled lisinopril hctz

## 2013-10-16 ENCOUNTER — Encounter: Payer: Self-pay | Admitting: Family Medicine

## 2013-10-16 ENCOUNTER — Ambulatory Visit (INDEPENDENT_AMBULATORY_CARE_PROVIDER_SITE_OTHER): Payer: Commercial Managed Care - HMO | Admitting: Family Medicine

## 2013-10-16 ENCOUNTER — Ambulatory Visit (HOSPITAL_COMMUNITY)
Admission: RE | Admit: 2013-10-16 | Discharge: 2013-10-16 | Disposition: A | Discharge status: Home / Self Care | Payer: Medicare HMO | Source: Ambulatory Visit | Attending: Family Medicine | Admitting: Family Medicine

## 2013-10-16 VITALS — BP 107/66 | HR 71 | Temp 98.4°F | Wt 108.0 lb

## 2013-10-16 DIAGNOSIS — I10 Essential (primary) hypertension: Secondary | ICD-10-CM | POA: Insufficient documentation

## 2013-10-16 DIAGNOSIS — R079 Chest pain, unspecified: Secondary | ICD-10-CM | POA: Insufficient documentation

## 2013-10-16 DIAGNOSIS — K219 Gastro-esophageal reflux disease without esophagitis: Secondary | ICD-10-CM | POA: Insufficient documentation

## 2013-10-16 MED ORDER — OMEPRAZOLE 40 MG PO CPDR: DELAYED_RELEASE_CAPSULE | ORAL | Status: DC

## 2013-10-16 NOTE — Patient Instructions (Signed)
Your symptoms are likely secondary to reflux.  Please start prilosec (acid reducing pill) for 2 weeks then as needed for worsening symptoms If you continue to have chest pain please call the clinic to follow up or go to the emergency room.    Gastroesophageal Reflux Disease, Adult Gastroesophageal reflux disease (GERD) happens when acid from your stomach flows up into the esophagus. When acid comes in contact with the esophagus, the acid causes soreness (inflammation) in the esophagus. Over time, GERD may create small holes (ulcers) in the lining of the esophagus. CAUSES   Increased body weight. This puts pressure on the stomach, making acid rise from the stomach into the esophagus.  Smoking. This increases acid production in the stomach.  Drinking alcohol. This causes decreased pressure in the lower esophageal sphincter (valve or ring of muscle between the esophagus and stomach), allowing acid from the stomach into the esophagus.  Late evening meals and a full stomach. This increases pressure and acid production in the stomach.  A malformed lower esophageal sphincter. Sometimes, no cause is found. SYMPTOMS   Burning pain in the lower part of the mid-chest behind the breastbone and in the mid-stomach area. This may occur twice a week or more often.  Trouble swallowing.  Sore throat.  Dry cough.  Asthma-like symptoms including chest tightness, shortness of breath, or wheezing. DIAGNOSIS  Your caregiver may be able to diagnose GERD based on your symptoms. In some cases, X-rays and other tests may be done to check for complications or to check the condition of your stomach and esophagus. TREATMENT  Your caregiver may recommend over-the-counter or prescription medicines to help decrease acid production. Ask your caregiver before starting or adding any new medicines.  HOME CARE INSTRUCTIONS   Change the factors that you can control. Ask your caregiver for guidance concerning weight  loss, quitting smoking, and alcohol consumption.  Avoid foods and drinks that make your symptoms worse, such as:  Caffeine or alcoholic drinks.  Chocolate.  Peppermint or mint flavorings.  Garlic and onions.  Spicy foods.  Citrus fruits, such as oranges, lemons, or limes.  Tomato-based foods such as sauce, chili, salsa, and pizza.  Fried and fatty foods.  Avoid lying down for the 3 hours prior to your bedtime or prior to taking a nap.  Eat small, frequent meals instead of large meals.  Wear loose-fitting clothing. Do not wear anything tight around your waist that causes pressure on your stomach.  Raise the head of your bed 6 to 8 inches with wood blocks to help you sleep. Extra pillows will not help.  Only take over-the-counter or prescription medicines for pain, discomfort, or fever as directed by your caregiver.  Do not take aspirin, ibuprofen, or other nonsteroidal anti-inflammatory drugs (NSAIDs). SEEK IMMEDIATE MEDICAL CARE IF:   You have pain in your arms, neck, jaw, teeth, or back.  Your pain increases or changes in intensity or duration.  You develop nausea, vomiting, or sweating (diaphoresis).  You develop shortness of breath, or you faint.  Your vomit is green, yellow, black, or looks like coffee grounds or blood.  Your stool is red, bloody, or black. These symptoms could be signs of other problems, such as heart disease, gastric bleeding, or esophageal bleeding. MAKE SURE YOU:   Understand these instructions.  Will watch your condition.  Will get help right away if you are not doing well or get worse. Document Released: 03/31/2005 Document Revised: 09/13/2011 Document Reviewed: 01/08/2011 ExitCare Patient Information 2014 [place]  

## 2013-10-16 NOTE — Assessment & Plan Note (Signed)
CP all likely related to GERD. EKG unchanged from previous Restart PPI. Pt to start w/ 2 wk daily therapy then prn thereafter. Avoid trigger foods F/u PCP prn

## 2013-10-16 NOTE — Progress Notes (Signed)
Nicole Robbins is a 68 y.o. female who presents to [facility] today for chest pain  CP: feels sharp and burning. Radiates to back. Present since Thu. Associated w/ increased weakness during this time. Ran out of PPI in December 2014. Pain is worse after meals and when lying down. Improves w/ sitting up and tylenol. Denies any fevers, n/v/d. CP is non-exertional.   Upper endoscopy in 2013 was nml.    The following portions of the patient's history were reviewed and updated as appropriate: allergies, current medications, past medical history, family and social history, and problem list.  Patient is a nonsmoker.  Past Medical History  Diagnosis Date  . Breast calcification, right   . Hypertension   . GERD (gastroesophageal reflux disease)   . Hepatitis     liver absess07-drained  . Hypopotassemia   . Vitamin D deficiency   . Anemia     ROS as above otherwise neg.    Medications reviewed. Current Outpatient Prescriptions  Medication Sig Dispense Refill  . lisinopril-hydrochlorothiazide (PRINZIDE,ZESTORETIC) 20-25 MG per tablet Take 1 tablet by mouth daily.  90 tablet  3   No current facility-administered medications for this visit.    Exam: BP 107/66  Pulse 71  Temp(Src) 98.4 F (36.9 C) (Oral)  Wt 108 lb (48.988 kg)  SpO2 100% Gen: Well NAD HEENT: EOMI,  MMM Lungs: CTABL Nl WOB Heart: RRR no MRG Abd: NABS, NT, ND Exts: Non edematous BL  LE, warm and well perfused.   No results found for this or any previous visit (from the past 72 hour(s)).  A/P (as seen in Problem list)  GERD (gastroesophageal reflux disease) CP all likely related to GERD. EKG unchanged from previous Restart PPI. Pt to start w/ 2 wk daily therapy then prn thereafter. Avoid trigger foods F/u PCP prn   Essential hypertension, benign At goal.  No change. Consider DC ACEi if continues to be this low as pt would be at risk of fall.

## 2013-10-16 NOTE — Assessment & Plan Note (Signed)
At goal.  No change. Consider DC ACEi if continues to be this low as pt would be at risk of fall.

## 2014-01-31 ENCOUNTER — Other Ambulatory Visit: Payer: Self-pay | Admitting: *Deleted

## 2014-02-01 MED ORDER — OMEPRAZOLE 40 MG PO CPDR: DELAYED_RELEASE_CAPSULE | ORAL | Status: DC

## 2014-04-24 ENCOUNTER — Encounter: Payer: Self-pay | Admitting: Internal Medicine

## 2014-04-26 ENCOUNTER — Ambulatory Visit (INDEPENDENT_AMBULATORY_CARE_PROVIDER_SITE_OTHER): Payer: Commercial Managed Care - HMO | Admitting: Family Medicine

## 2014-04-26 ENCOUNTER — Encounter: Payer: Self-pay | Admitting: Family Medicine

## 2014-04-26 VITALS — BP 182/75 | HR 86 | Temp 97.8°F | Ht 62.0 in | Wt 126.8 lb

## 2014-04-26 DIAGNOSIS — R600 Localized edema: Secondary | ICD-10-CM | POA: Diagnosis not present

## 2014-04-26 DIAGNOSIS — I10 Essential (primary) hypertension: Secondary | ICD-10-CM | POA: Diagnosis not present

## 2014-04-26 LAB — COMPREHENSIVE METABOLIC PANEL
ALK PHOS: 59 U/L (ref 39–117)
ALT: 17 U/L (ref 0–35)
AST: 39 U/L — AB (ref 0–37)
Albumin: 3.5 g/dL (ref 3.5–5.2)
BILIRUBIN TOTAL: 0.2 mg/dL (ref 0.2–1.2)
BUN: 13 mg/dL (ref 6–23)
CO2: 25 mEq/L (ref 19–32)
Calcium: 8.6 mg/dL (ref 8.4–10.5)
Chloride: 108 mEq/L (ref 96–112)
Creat: 0.68 mg/dL (ref 0.50–1.10)
Glucose, Bld: 94 mg/dL (ref 70–99)
Potassium: 3.2 mEq/L — ABNORMAL LOW (ref 3.5–5.3)
SODIUM: 142 meq/L (ref 135–145)
Total Protein: 6.1 g/dL (ref 6.0–8.3)

## 2014-04-26 LAB — CBC
HCT: 27 % — ABNORMAL LOW (ref 36.0–46.0)
Hemoglobin: 8.3 g/dL — ABNORMAL LOW (ref 12.0–15.0)
MCH: 23.2 pg — AB (ref 26.0–34.0)
MCHC: 30.7 g/dL (ref 30.0–36.0)
MCV: 75.4 fL — AB (ref 78.0–100.0)
PLATELETS: 285 10*3/uL (ref 150–400)
RBC: 3.58 MIL/uL — ABNORMAL LOW (ref 3.87–5.11)
RDW: 15.5 % (ref 11.5–15.5)
WBC: 8.4 10*3/uL (ref 4.0–10.5)

## 2014-04-26 MED ORDER — OMEPRAZOLE 40 MG PO CPDR: DELAYED_RELEASE_CAPSULE | ORAL | Status: DC

## 2014-04-26 MED ORDER — LISINOPRIL-HYDROCHLOROTHIAZIDE 20-25 MG PO TABS: 1.0000 | ORAL_TABLET | Freq: Every day | ORAL | Status: DC

## 2014-04-26 MED ORDER — POLYETHYLENE GLYCOL 3350 17 G PO PACK: 17.0000 g | PACK | Freq: Every day | ORAL | Status: DC | PRN

## 2014-04-26 NOTE — Progress Notes (Signed)
   Subjective:    Patient ID: Nicole Robbins, female    DOB: [birth year], 68 y.o.   MRN: [id number]  HPI Patient presents for same-day visit for c/o lower extremity swelling for the past 3 weeks. 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 Denies fevers or chills, has had mild cough and generalized malaise and wheeze in the past few days, as with a cold.  HAs been taking Alka-Seltzer Plus and otc cough syrups for the cough. Denies chest pain, but does get a retrosternal pain and choking if she lays down after eating; omeprazole helps with this.   Distant history of cigarette smoking 1984 quit.   History of HTN, however has run out of lisinopril/HCTZ in the Spring (around March or April) and has not taken since.   ROS" See above.  Denies dysuria; has been drinking a lot of tea and water lately and thus attributes increased urination to this increased intake. Denies increased thirst.     Review of Systems     Objective:   Physical Exam Well appearing, no apparent distress. Speaking fluidly  HEENT Neck supple, no cervical adenopathy.  COR regular S1S2, no extra sounds PULM Clear bilaterally, no rales or wheezes EXTS: 1+ pitting edema bilaterally to about 1/2 way up shins; palpable dp pulses bilaterally. Sensation grossly intact both feet. No popliteal tenderness, no cords. No disparate girth of calves. No skin discoloration.  NEURO Gait unremarkable, no assistance.       Assessment & Plan:

## 2014-04-26 NOTE — Patient Instructions (Signed)
It was a pleasure to see you today.  I am checking some lab work for your blood pressure and the swelling in your legs.   I would like you to go back to taking the lisinpril/HCTZ one tablet daily for your blood pressure. Hold the Alka-Seltzer Plus and the cough syrups, as these can make your blood pressure go up.  Keep wearing stockings while you are standing, and put your legs up when you are home from work.   I will call you at home [phone] with the lab results.    I would like you to come back for a follow up visit in the coming 2 to 4 weeks with your primary doctor.

## 2014-04-26 NOTE — Assessment & Plan Note (Signed)
Patient off meds for 5 months.  Resume lisinopril/HCTZ; recheck renal function and lytes today. Reassess BP and lower extremity edema in 2 to 3 weeks.

## 2014-04-30 ENCOUNTER — Telehealth: Payer: Self-pay | Admitting: Family Medicine

## 2014-04-30 ENCOUNTER — Encounter: Payer: Self-pay | Admitting: Family Medicine

## 2014-04-30 NOTE — Telephone Encounter (Signed)
Call to patient to report lab results; no answer.  Left voice message.  Of note, CBC with microcytic anemia. Will send letter with labs, to recheck at follow up visit.  To evaluate for etiology of likely iron deficiency in a 68 year old woman.  JB

## 2014-05-17 ENCOUNTER — Ambulatory Visit (INDEPENDENT_AMBULATORY_CARE_PROVIDER_SITE_OTHER): Payer: Commercial Managed Care - HMO | Admitting: Family Medicine

## 2014-05-17 ENCOUNTER — Encounter: Payer: Self-pay | Admitting: Family Medicine

## 2014-05-17 VITALS — BP 128/70 | HR 59 | Temp 98.2°F | Ht 62.0 in | Wt 116.7 lb

## 2014-05-17 DIAGNOSIS — R740 Nonspecific elevation of levels of transaminase and lactic acid dehydrogenase [LDH]: Secondary | ICD-10-CM

## 2014-05-17 DIAGNOSIS — R7401 Elevation of levels of liver transaminase levels: Secondary | ICD-10-CM | POA: Insufficient documentation

## 2014-05-17 DIAGNOSIS — E876 Hypokalemia: Secondary | ICD-10-CM

## 2014-05-17 DIAGNOSIS — K2100 Gastro-esophageal reflux disease with esophagitis, without bleeding: Secondary | ICD-10-CM

## 2014-05-17 DIAGNOSIS — D509 Iron deficiency anemia, unspecified: Secondary | ICD-10-CM

## 2014-05-17 HISTORY — DX: Iron deficiency anemia, unspecified: D50.9

## 2014-05-17 LAB — FERRITIN: Ferritin: 8 ng/mL — ABNORMAL LOW (ref 10–291)

## 2014-05-17 LAB — CBC
HCT: 32.3 % — ABNORMAL LOW (ref 36.0–46.0)
HEMOGLOBIN: 9.9 g/dL — AB (ref 12.0–15.0)
MCH: 23.1 pg — ABNORMAL LOW (ref 26.0–34.0)
MCHC: 30.7 g/dL (ref 30.0–36.0)
MCV: 75.5 fL — AB (ref 78.0–100.0)
Platelets: 272 10*3/uL (ref 150–400)
RBC: 4.28 MIL/uL (ref 3.87–5.11)
RDW: 16.1 % — ABNORMAL HIGH (ref 11.5–15.5)
WBC: 5.6 10*3/uL (ref 4.0–10.5)

## 2014-05-17 MED ORDER — PANTOPRAZOLE SODIUM 40 MG PO TBEC: 40.0000 mg | DELAYED_RELEASE_TABLET | Freq: Every day | ORAL | Status: DC

## 2014-05-17 NOTE — Patient Instructions (Signed)
It was a pleasure to see you today.    I am concerned that your anemia is from small amount of blood loss from your gastrointestinal system.  The endoscopy you had in November 2013 showed some erosions in your esophagus and stomach, which would be a likely source of blood loss.  1. I am referring you back to [facility], for consideration of another endoscopy.  2. Rechecking your anemia labs today; I will contact you at home [phone] between 930am and 5pm.  3. I am changing your acid-blocking medicine.  STOP THE OMEPRAZOLE, and START PROTONIX 40mg  ONE TABLET ONE TIME DAILY (sent to your pharmacy).  I would like you to see your primary physician after you have seen the GI doctor.

## 2014-05-18 LAB — HEPATITIS C ANTIBODY, REFLEX: HCV AB: NEGATIVE

## 2014-05-20 LAB — BASIC METABOLIC PANEL
BUN: 25 mg/dL — AB (ref 6–23)
CALCIUM: 9.2 mg/dL (ref 8.4–10.5)
CHLORIDE: 103 meq/L (ref 96–112)
CO2: 26 meq/L (ref 19–32)
Creat: 0.81 mg/dL (ref 0.50–1.10)
GLUCOSE: 87 mg/dL (ref 70–99)
Potassium: 3.7 mEq/L (ref 3.5–5.3)
Sodium: 140 mEq/L (ref 135–145)

## 2014-05-20 LAB — IBC PANEL
%SAT: 7 % — ABNORMAL LOW (ref 20–55)
TIBC: 439 ug/dL (ref 250–470)
UIBC: 407 ug/dL — ABNORMAL HIGH (ref 125–400)

## 2014-05-20 LAB — IRON: Iron: 32 ug/dL — ABNORMAL LOW (ref 42–145)

## 2014-05-20 NOTE — Assessment & Plan Note (Addendum)
Suspected upper GI source of microcytic anemia; will refer back to GI for consideration EGD.  Recheck CBC and iron studies today. Avoidance of BC and NSAIDS.  Change PPI to Protonix from omeprazole.

## 2014-05-20 NOTE — Progress Notes (Signed)
   Subjective:    Patient ID: Nicole Robbins, female    DOB: 10/08/1945, 68 y.o.   MRN: [id number]  HPI Patient seen in follow up for recent labs showing microcytic anemia.  Patient reports feeling 'tired' most of the time, even after getting good night's sleep. Reports a prior history of anemia in 2007, after which she was prescribed iron tablets.  She does not take iron now.   Denies hematochezia or melena, no hematemesis, no epistaxis or bleeding from gums.  LMP in her 77s, no vaginal bleeding. She does describe GERD symptoms with associated retrosternal burning, may be worsened by coffee.   She had colonoscopy and EGD by Dr Sharlett Iles [facility]) on May 15, 2012, showing antral gastropathy and esophagitis.  She reports that the omeprazole she is taking does not help her symptoms.   Social Hx; She quit cigarette smoking in 1984.  She does not drink alcohol, does not use NSAIDs often, but occasionally takes BC powder.   Review of Systems No fevers/chills, no diarrhea/constipation, no chest pain, no pain radiating to jaw, no cough, no sputum production. Lower extremity swelling resolved since last visit.     Objective:   Physical Exam Well appearing, no apparent distress HEENT Neck supple, no cervical adenopathy. No conjunctival pallor. EOMI> Sublingual mucosa perhaps mild pallor. MMM COR Regular S1S2, no extra sounds PULM CLear bilaterally, no rales or wheezes ABD Soft, nontender, nondistended. Audible bowel sounds. No masses noted.  EXTS trace ankle edema bilaterally.        Assessment & Plan:

## 2014-05-20 NOTE — Assessment & Plan Note (Signed)
To check HCV, for elevated transaminase level.  Also indicated for cohort screening, given patient's birth year of [birth year].

## 2014-05-20 NOTE — Assessment & Plan Note (Signed)
Patient with symptoms of GERD with esophagitis; prior EGD 05/15/2012 showing antral gastropathy with esophagitis. Given anemia and apparent symptoms related to GERD, suspect upper GI blood loss is responsible for microcytic anemia. Referral to GI for consideration of repeat endoscopy.  Patient agrees with this plan.

## 2014-05-22 ENCOUNTER — Encounter: Payer: Self-pay | Admitting: Family Medicine

## 2014-05-22 ENCOUNTER — Telehealth: Payer: Self-pay | Admitting: Family Medicine

## 2014-05-22 DIAGNOSIS — D509 Iron deficiency anemia, unspecified: Secondary | ICD-10-CM

## 2014-05-22 MED ORDER — FERROUS SULFATE 325 (65 FE) MG PO TABS: 325.0000 mg | ORAL_TABLET | Freq: Three times a day (TID) | ORAL | Status: DC

## 2014-05-22 NOTE — Telephone Encounter (Signed)
Called to report labs to patient; no answer. Left voice message and will send letter.   Patient has iron-deficiency anemia. To prescribe oral iron FeSO4 325mg  three times daily. She is to see GI (arranged at her last visit).   JB

## 2014-06-11 ENCOUNTER — Other Ambulatory Visit: Payer: Self-pay

## 2014-06-11 DIAGNOSIS — Z1231 Encounter for screening mammogram for malignant neoplasm of breast: Secondary | ICD-10-CM

## 2014-07-15 ENCOUNTER — Encounter: Payer: Self-pay | Admitting: *Deleted

## 2014-07-17 ENCOUNTER — Ambulatory Visit
Admission: RE | Admit: 2014-07-17 | Discharge: 2014-07-17 | Disposition: A | Discharge status: Home / Self Care | Payer: Medicare Other | Source: Ambulatory Visit

## 2014-07-17 DIAGNOSIS — Z1231 Encounter for screening mammogram for malignant neoplasm of breast: Secondary | ICD-10-CM

## 2014-07-24 ENCOUNTER — Other Ambulatory Visit (INDEPENDENT_AMBULATORY_CARE_PROVIDER_SITE_OTHER): Payer: Medicare Other

## 2014-07-24 ENCOUNTER — Encounter: Payer: Self-pay | Admitting: Internal Medicine

## 2014-07-24 ENCOUNTER — Ambulatory Visit (INDEPENDENT_AMBULATORY_CARE_PROVIDER_SITE_OTHER): Payer: Medicare Other | Admitting: Internal Medicine

## 2014-07-24 VITALS — BP 100/70 | HR 68 | Ht 62.0 in | Wt 115.8 lb

## 2014-07-24 DIAGNOSIS — D509 Iron deficiency anemia, unspecified: Secondary | ICD-10-CM | POA: Diagnosis not present

## 2014-07-24 DIAGNOSIS — K219 Gastro-esophageal reflux disease without esophagitis: Secondary | ICD-10-CM

## 2014-07-24 DIAGNOSIS — Z8719 Personal history of other diseases of the digestive system: Secondary | ICD-10-CM

## 2014-07-24 LAB — CBC WITH DIFFERENTIAL/PLATELET
Basophils Absolute: 0 10*3/uL (ref 0.0–0.1)
Basophils Relative: 0.2 % (ref 0.0–3.0)
Eosinophils Absolute: 0.2 10*3/uL (ref 0.0–0.7)
Eosinophils Relative: 3.4 % (ref 0.0–5.0)
HCT: 35.6 % — ABNORMAL LOW (ref 36.0–46.0)
HEMOGLOBIN: 11.6 g/dL — AB (ref 12.0–15.0)
LYMPHS PCT: 32.8 % (ref 12.0–46.0)
Lymphs Abs: 2.1 10*3/uL (ref 0.7–4.0)
MCHC: 32.7 g/dL (ref 30.0–36.0)
MCV: 80.8 fl (ref 78.0–100.0)
MONOS PCT: 7.2 % (ref 3.0–12.0)
Monocytes Absolute: 0.5 10*3/uL (ref 0.1–1.0)
NEUTROS PCT: 56.4 % (ref 43.0–77.0)
Neutro Abs: 3.7 10*3/uL (ref 1.4–7.7)
Platelets: 208 10*3/uL (ref 150.0–400.0)
RBC: 4.41 Mil/uL (ref 3.87–5.11)
RDW: 22.6 % — AB (ref 11.5–15.5)
WBC: 6.5 10*3/uL (ref 4.0–10.5)

## 2014-07-24 LAB — IBC PANEL
Iron: 74 ug/dL (ref 42–145)
SATURATION RATIOS: 20.2 % (ref 20.0–50.0)
Transferrin: 262 mg/dL (ref 212.0–360.0)

## 2014-07-24 LAB — FERRITIN: Ferritin: 20.8 ng/mL (ref 10.0–291.0)

## 2014-07-24 NOTE — Patient Instructions (Addendum)
Per Dr Hilarie Fredrickson stop the omeprazole.  Your physician has requested that you go to the basement for the following lab work before leaving today: CBC/diff, Iron studies, (today's and future orders put in)  We want you to follow up with Dr Hilarie Fredrickson on 09/25/14 at 1:45pm.  Please get blood drawn prior to your visit.  No appointment needed in our lab, they are open 7:30-5:30pm Monday-Friday.  I appreciate the opportunity to care for you.

## 2014-07-25 NOTE — Progress Notes (Signed)
Subjective:    Patient ID: Nicole Robbins, female    DOB: Dec 01, 1945, 69 y.o.   MRN: [id number]  HPI Nicole Robbins is a 69 yo female with PMH of IDA, heme positive stool, erosive esophagitis and chronic functional constipation who is seen in follow-up at the request of her primary care provider for iron deficiency anemia. The patient reports overall she is feeling well today. She has a history of esophagitis but she currently denies heartburn. She denies dysphagia or odynophagia. She has taken pantoprazole 40 mg daily. She was previous to taking omeprazole but she felt like this made her nauseous. She is taking oral iron one tablet reports that her bowel movements have been fairly regular on MiraLAX. She denies blood in her stool or melena. Her stools have been dark while on oral iron. She reports a stable weight and this is consistent with our records as she was 110 pounds in 2013, 1:15 pounds today. She denies abdominal pain. Denies fever or chills. Reports good appetite. 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 This was percutaneously drained by radiology. She denies further issues with her liver since then. Viral hepatitis serologies were negative   Review of Systems As per history of present illness, otherwise negative  Current Medications, Allergies, Past Medical History, Past Surgical History, Family History and Social History were reviewed in [employer] record.     Objective:   Physical Exam BP 100/70 mmHg  Pulse 68  Ht 5\' 2"  (1.575 m)  Wt 115 lb 12.8 oz (52.527 kg)  BMI 21.17 kg/m2 Constitutional: Well-developed and well-nourished. No distress. HEENT: Normocephalic and atraumatic. Oropharynx is clear and moist. No oropharyngeal exudate. Conjunctivae are normal.  No scleral icterus. Neck: Neck supple. Trachea midline. Cardiovascular: Normal rate,  regular rhythm and intact distal pulses. No M/R/G Pulmonary/chest: Effort normal and breath sounds normal. No wheezing, rales or rhonchi. Abdominal: Soft, nontender, nondistended. Bowel sounds active throughout. There are no masses palpable. No hepatosplenomegaly. Extremities: no clubbing, cyanosis, or edema Neurological: Alert and oriented to person place and time. Skin: Skin is warm and dry. No rashes noted. Psychiatric: Normal mood and affect. Behavior is normal.   EGD performed 05/15/2012 by Dr. Verl Blalock. Severe esophagitis in the distal one third of the esophagus with tight stricture dilated with the scope. Mild antral gastropathy biopsied for CLO and negative. Seen on retroflexion Hiatal hernia. Normal-appearing duodenum. Colonoscopy 05/15/2012  -- normal: Though very redundant.   CBC    Component Value Date/Time   WBC 6.5 07/24/2014 1632   RBC 4.41 07/24/2014 1632   HGB 11.6* 07/24/2014 1632   HCT 35.6* 07/24/2014 1632   PLT 208.0 07/24/2014 1632   MCV 80.8 07/24/2014 1632   MCH 23.1* 05/17/2014 1208   MCHC 32.7 07/24/2014 1632   RDW 22.6* 07/24/2014 1632   LYMPHSABS 2.1 07/24/2014 1632   MONOABS 0.5 07/24/2014 1632   EOSABS 0.2 07/24/2014 1632   BASOSABS 0.0 07/24/2014 1632    Iron/TIBC/Ferritin/ %Sat    Component Value Date/Time   IRON 74 07/24/2014 1632   TIBC 439 05/17/2014 1208   FERRITIN 20.8 07/24/2014 1632   IRONPCTSAT 20.2 07/24/2014 1632   IRONPCTSAT 7* 05/17/2014 1208      Assessment & Plan:  69 yo female with PMH of IDA, heme positive stool, erosive esophagitis and chronic functional constipation who is seen in follow-up at the  request of her primary care provider for iron deficiency anemia.  1. IDA with hx of severe esophagitis and mild erosive gastritis -- given iron deficiency anemia that was most prominent in November I recommended repeat upper endoscopy for direct visualization especially in light of the fact that she had severe esophagitis in  the past. She currently denies GERD and esophagitis symptoms though she is on pantoprazole 40 mg daily. Her hemoglobin remains slightly normal though is somewhat better as are her iron studies. She does not wish to repeat upper endoscopy at this time though she is aware of my recommendation. I asked that she continue oral iron 3 times daily for the next 3 months and return to see me in 8-12 weeks. We will reassess symptoms at that time and if any symptoms persist or anemia persists, I will again recommend upper endoscopy.   2. CRC screening  -- normal colonoscopy in 2000 and her teen, this test likely does not need to be repeated at this time. If IDA persists an upper endoscopy negative would pursue capsule endoscopy

## 2014-07-29 ENCOUNTER — Encounter: Payer: Self-pay | Admitting: Gastroenterology

## 2014-08-09 ENCOUNTER — Other Ambulatory Visit: Payer: Self-pay | Admitting: Family Medicine

## 2014-08-09 DIAGNOSIS — K219 Gastro-esophageal reflux disease without esophagitis: Secondary | ICD-10-CM

## 2014-08-12 ENCOUNTER — Other Ambulatory Visit: Payer: Self-pay | Admitting: *Deleted

## 2014-08-12 DIAGNOSIS — B001 Herpesviral vesicular dermatitis: Secondary | ICD-10-CM

## 2014-08-15 MED ORDER — VALACYCLOVIR HCL 1 G PO TABS: 2000.0000 mg | ORAL_TABLET | Freq: Two times a day (BID) | ORAL | Status: DC

## 2014-08-21 ENCOUNTER — Other Ambulatory Visit: Payer: Self-pay | Admitting: Family Medicine

## 2014-08-21 DIAGNOSIS — K219 Gastro-esophageal reflux disease without esophagitis: Secondary | ICD-10-CM

## 2014-09-12 ENCOUNTER — Encounter: Payer: Self-pay | Admitting: *Deleted

## 2014-09-17 ENCOUNTER — Other Ambulatory Visit (INDEPENDENT_AMBULATORY_CARE_PROVIDER_SITE_OTHER): Payer: Medicare Other

## 2014-09-17 DIAGNOSIS — D509 Iron deficiency anemia, unspecified: Secondary | ICD-10-CM | POA: Diagnosis not present

## 2014-09-17 LAB — CBC WITH DIFFERENTIAL/PLATELET
Basophils Absolute: 0 10*3/uL (ref 0.0–0.1)
Basophils Relative: 0.7 % (ref 0.0–3.0)
EOS ABS: 0.2 10*3/uL (ref 0.0–0.7)
EOS PCT: 3.4 % (ref 0.0–5.0)
HCT: 36.5 % (ref 36.0–46.0)
Hemoglobin: 12.1 g/dL (ref 12.0–15.0)
LYMPHS PCT: 36.5 % (ref 12.0–46.0)
Lymphs Abs: 2.3 10*3/uL (ref 0.7–4.0)
MCHC: 33.1 g/dL (ref 30.0–36.0)
MCV: 87.2 fl (ref 78.0–100.0)
MONO ABS: 0.4 10*3/uL (ref 0.1–1.0)
MONOS PCT: 5.8 % (ref 3.0–12.0)
NEUTROS PCT: 53.6 % (ref 43.0–77.0)
Neutro Abs: 3.4 10*3/uL (ref 1.4–7.7)
Platelets: 208 10*3/uL (ref 150.0–400.0)
RBC: 4.19 Mil/uL (ref 3.87–5.11)
RDW: 14.8 % (ref 11.5–15.5)
WBC: 6.3 10*3/uL (ref 4.0–10.5)

## 2014-09-17 LAB — IBC PANEL
IRON: 33 ug/dL — AB (ref 42–145)
Saturation Ratios: 10.2 % — ABNORMAL LOW (ref 20.0–50.0)
TRANSFERRIN: 231 mg/dL (ref 212.0–360.0)

## 2014-09-17 LAB — FERRITIN: Ferritin: 38.5 ng/mL (ref 10.0–291.0)

## 2014-09-25 ENCOUNTER — Encounter: Payer: Self-pay | Admitting: Internal Medicine

## 2014-09-25 ENCOUNTER — Ambulatory Visit (INDEPENDENT_AMBULATORY_CARE_PROVIDER_SITE_OTHER): Payer: Medicare Other | Admitting: Internal Medicine

## 2014-09-25 VITALS — BP 120/62 | HR 60 | Ht 61.75 in | Wt 118.1 lb

## 2014-09-25 DIAGNOSIS — D509 Iron deficiency anemia, unspecified: Secondary | ICD-10-CM

## 2014-09-25 MED ORDER — FERROUS SULFATE 325 (65 FE) MG PO TABS: 325.0000 mg | ORAL_TABLET | Freq: Three times a day (TID) | ORAL | Status: DC

## 2014-09-25 NOTE — Progress Notes (Signed)
Subjective:    Patient ID: Nicole Robbins, female    DOB: [birth year], 69 y.o.   MRN: [id number]  HPI Nicole Robbins is a 69 yo female with PMH of iron deficiency anemia, heme positive stool, erosive esophagitis, gastritis, and chronic functional constipation who is seen in follow-up. I last saw her in January 2016. At that time I recommended upper endoscopy but she deferred. She was maintained on pantoprazole 40 mg daily along with oral iron 325 mg 3 times a day. Today she returns for follow-up. She recently had hemoglobin which fortunately had normalized though iron studies remained low. Today she reports that she is feeling well. She has gained about 3 pounds and she thinks her appetite improved with oral iron. She denies trouble swallowing. Heartburn is well controlled. She had one episode of breakthrough heartburn in the last month which she states was secondary to eating late.  She denies dysphagia or odynophagia. Denies early satiety. Denies abdominal pain. Reports bowel movements are regular with MiraLAX 17 g daily. Stools of been dark on oral iron but she denies visible blood.  Review of Systems as per history of present illness, otherwise negative  Current Medications, Allergies, Past Medical History, Past Surgical History, Family History and Social History were reviewed in [employer] record.      Objective:   Physical Exam BP 120/62 mmHg  Pulse 60  Ht 5' 1.75" (1.568 m)  Wt 118 lb 2 oz (53.581 kg)  BMI 21.79 kg/m2 Constitutional: Well-developed and well-nourished. No distress. HEENT: Normocephalic and atraumatic. Marland Kitchen Conjunctivae are normal.  No scleral icterus. Neck: Neck supple. Trachea midline. Cardiovascular: Normal rate, regular rhythm and intact distal pulses. No M/R/G Pulmonary/chest: Effort normal and breath sounds normal. No wheezing, rales or rhonchi. Abdominal: Soft, nontender, nondistended. Bowel sounds active throughout.  Extremities: no clubbing,  cyanosis, or edema Neurological: Alert and oriented to person place and time. Skin: Skin is warm and dry. No rashes noted. Psychiatric: Normal mood and affect. Behavior is normal.  CBC    Component Value Date/Time   WBC 6.3 09/17/2014 1216   RBC 4.19 09/17/2014 1216   HGB 12.1 09/17/2014 1216   HCT 36.5 09/17/2014 1216   PLT 208.0 09/17/2014 1216   MCV 87.2 09/17/2014 1216   MCH 23.1* 05/17/2014 1208   MCHC 33.1 09/17/2014 1216   RDW 14.8 09/17/2014 1216   LYMPHSABS 2.3 09/17/2014 1216   MONOABS 0.4 09/17/2014 1216   EOSABS 0.2 09/17/2014 1216   BASOSABS 0.0 09/17/2014 1216    Iron/TIBC/Ferritin/ %Sat    Component Value Date/Time   IRON 33* 09/17/2014 1216   TIBC 439 05/17/2014 1208   FERRITIN 38.5 09/17/2014 1216   IRONPCTSAT 10.2* 09/17/2014 1216   IRONPCTSAT 7* 05/17/2014 1208      Assessment & Plan:  69 yo female with PMH of iron deficiency anemia, heme positive stool, erosive esophagitis, gastritis, and chronic functional constipation who is seen in follow-up.  1. Iron deficiency with history of esophagitis and gastritis -- again my recommendation is for upper endoscopy because she continues to have low iron. Fortunately her hemoglobin has normalized. She does not wish to pursue upper endoscopy at this time because overall she feels well. She is aware of my recommendation. We'll continue oral iron 325 mg 3 times a day and repeat iron studies and CBC in 3 months. I've asked that she continue pantoprazole 40 mg daily given her history of reflux, esophagitis and gastritis.  2. CRC screening -- normal  colonoscopy in 2000 and 2013.

## 2014-09-25 NOTE — Patient Instructions (Signed)
We have sent the following medications to your pharmacy for you to pick up at your convenience: Iron three times daily  Your physician has requested that you go to the basement for the following lab work 12/30/14: CBC, IBC  Please follow up as needed with Dr Hilarie Fredrickson

## 2014-11-11 ENCOUNTER — Other Ambulatory Visit: Payer: Self-pay | Admitting: *Deleted

## 2014-11-11 DIAGNOSIS — I10 Essential (primary) hypertension: Secondary | ICD-10-CM

## 2014-11-12 MED ORDER — LISINOPRIL-HYDROCHLOROTHIAZIDE 20-25 MG PO TABS: 1.0000 | ORAL_TABLET | Freq: Every day | ORAL | Status: DC

## 2014-12-30 ENCOUNTER — Other Ambulatory Visit (INDEPENDENT_AMBULATORY_CARE_PROVIDER_SITE_OTHER): Payer: Medicare Other

## 2014-12-30 DIAGNOSIS — D509 Iron deficiency anemia, unspecified: Secondary | ICD-10-CM | POA: Diagnosis not present

## 2014-12-30 LAB — CBC WITH DIFFERENTIAL/PLATELET
BASOS PCT: 0.5 % (ref 0.0–3.0)
Basophils Absolute: 0 10*3/uL (ref 0.0–0.1)
EOS PCT: 3.9 % (ref 0.0–5.0)
Eosinophils Absolute: 0.3 10*3/uL (ref 0.0–0.7)
HEMATOCRIT: 36.3 % (ref 36.0–46.0)
Hemoglobin: 12 g/dL (ref 12.0–15.0)
LYMPHS ABS: 2.2 10*3/uL (ref 0.7–4.0)
Lymphocytes Relative: 34.6 % (ref 12.0–46.0)
MCHC: 33 g/dL (ref 30.0–36.0)
MCV: 88.5 fl (ref 78.0–100.0)
MONOS PCT: 6.2 % (ref 3.0–12.0)
Monocytes Absolute: 0.4 10*3/uL (ref 0.1–1.0)
Neutro Abs: 3.5 10*3/uL (ref 1.4–7.7)
Neutrophils Relative %: 54.8 % (ref 43.0–77.0)
PLATELETS: 205 10*3/uL (ref 150.0–400.0)
RBC: 4.1 Mil/uL (ref 3.87–5.11)
RDW: 13 % (ref 11.5–15.5)
WBC: 6.4 10*3/uL (ref 4.0–10.5)

## 2014-12-30 LAB — IBC PANEL
Iron: 134 ug/dL (ref 42–145)
Saturation Ratios: 40.4 % (ref 20.0–50.0)
Transferrin: 237 mg/dL (ref 212.0–360.0)

## 2014-12-31 ENCOUNTER — Other Ambulatory Visit: Payer: Self-pay | Admitting: *Deleted

## 2014-12-31 DIAGNOSIS — D509 Iron deficiency anemia, unspecified: Secondary | ICD-10-CM

## 2015-03-14 ENCOUNTER — Other Ambulatory Visit: Payer: Self-pay | Admitting: Family Medicine

## 2015-03-14 DIAGNOSIS — I10 Essential (primary) hypertension: Secondary | ICD-10-CM

## 2015-06-27 ENCOUNTER — Telehealth: Payer: Self-pay

## 2015-06-27 NOTE — Telephone Encounter (Signed)
Left message for pt to call back.  Pt aware. 

## 2015-06-27 NOTE — Telephone Encounter (Signed)
-----   Message from Hulan Saas, RN sent at 12/31/2014 10:17 AM EDT ----- Call and remind patient due for labs for JMP on 07/01/15. Lab in [facility].

## 2015-07-01 ENCOUNTER — Other Ambulatory Visit (INDEPENDENT_AMBULATORY_CARE_PROVIDER_SITE_OTHER): Payer: Medicare Other

## 2015-07-01 DIAGNOSIS — D509 Iron deficiency anemia, unspecified: Secondary | ICD-10-CM

## 2015-07-01 LAB — CBC WITH DIFFERENTIAL/PLATELET
BASOS ABS: 0 10*3/uL (ref 0.0–0.1)
BASOS PCT: 0.4 % (ref 0.0–3.0)
EOS ABS: 0.2 10*3/uL (ref 0.0–0.7)
Eosinophils Relative: 2.6 % (ref 0.0–5.0)
HEMATOCRIT: 36.2 % (ref 36.0–46.0)
HEMOGLOBIN: 11.6 g/dL — AB (ref 12.0–15.0)
Lymphocytes Relative: 30 % (ref 12.0–46.0)
Lymphs Abs: 2.4 10*3/uL (ref 0.7–4.0)
MCHC: 32.1 g/dL (ref 30.0–36.0)
MCV: 87.3 fl (ref 78.0–100.0)
Monocytes Absolute: 0.5 10*3/uL (ref 0.1–1.0)
Monocytes Relative: 6.5 % (ref 3.0–12.0)
Neutro Abs: 4.9 10*3/uL (ref 1.4–7.7)
Neutrophils Relative %: 60.5 % (ref 43.0–77.0)
Platelets: 227 10*3/uL (ref 150.0–400.0)
RBC: 4.15 Mil/uL (ref 3.87–5.11)
RDW: 13.6 % (ref 11.5–15.5)
WBC: 8.1 10*3/uL (ref 4.0–10.5)

## 2015-07-01 LAB — IBC PANEL
Iron: 57 ug/dL (ref 42–145)
SATURATION RATIOS: 14.7 % — AB (ref 20.0–50.0)
Transferrin: 277 mg/dL (ref 212.0–360.0)

## 2015-07-01 LAB — FERRITIN: Ferritin: 11.5 ng/mL (ref 10.0–291.0)

## 2015-07-02 ENCOUNTER — Other Ambulatory Visit: Payer: Self-pay

## 2015-07-02 DIAGNOSIS — D500 Iron deficiency anemia secondary to blood loss (chronic): Secondary | ICD-10-CM

## 2015-07-02 MED ORDER — IRON 325 (65 FE) MG PO TABS: 325.0000 mg | ORAL_TABLET | Freq: Three times a day (TID) | ORAL | Status: DC

## 2015-07-02 MED ORDER — FERROUS SULFATE 325 (65 FE) MG PO TABS: 325.0000 mg | ORAL_TABLET | Freq: Three times a day (TID) | ORAL | Status: DC

## 2015-07-20 ENCOUNTER — Other Ambulatory Visit: Payer: Self-pay | Admitting: Family Medicine

## 2015-07-20 DIAGNOSIS — K219 Gastro-esophageal reflux disease without esophagitis: Secondary | ICD-10-CM

## 2015-08-21 ENCOUNTER — Other Ambulatory Visit: Payer: Self-pay

## 2015-08-21 DIAGNOSIS — Z1231 Encounter for screening mammogram for malignant neoplasm of breast: Secondary | ICD-10-CM

## 2015-09-03 ENCOUNTER — Ambulatory Visit
Admission: RE | Admit: 2015-09-03 | Discharge: 2015-09-03 | Disposition: A | Discharge status: Home / Self Care | Payer: Medicare Other | Source: Ambulatory Visit

## 2015-09-03 DIAGNOSIS — Z1231 Encounter for screening mammogram for malignant neoplasm of breast: Secondary | ICD-10-CM

## 2015-09-30 ENCOUNTER — Telehealth: Payer: Self-pay

## 2015-09-30 NOTE — Telephone Encounter (Signed)
Unable to reach pt by phone. Letter mailed to pt to come and have labs drawn.

## 2015-09-30 NOTE — Telephone Encounter (Signed)
-----   Message from Marlon Pel, RN sent at 07/02/2015  4:56 PM EST ----- Needs labs see results from 12/28- sheri pyrtle patient

## 2015-11-25 ENCOUNTER — Other Ambulatory Visit (INDEPENDENT_AMBULATORY_CARE_PROVIDER_SITE_OTHER): Payer: Medicare Other

## 2015-11-25 DIAGNOSIS — D500 Iron deficiency anemia secondary to blood loss (chronic): Secondary | ICD-10-CM

## 2015-11-25 LAB — CBC WITH DIFFERENTIAL/PLATELET
BASOS ABS: 0 10*3/uL (ref 0.0–0.1)
BASOS PCT: 0.1 % (ref 0.0–3.0)
EOS ABS: 0.2 10*3/uL (ref 0.0–0.7)
Eosinophils Relative: 2.1 % (ref 0.0–5.0)
HCT: 38 % (ref 36.0–46.0)
HEMOGLOBIN: 12.4 g/dL (ref 12.0–15.0)
Lymphocytes Relative: 16 % (ref 12.0–46.0)
Lymphs Abs: 1.6 10*3/uL (ref 0.7–4.0)
MCHC: 32.6 g/dL (ref 30.0–36.0)
MCV: 88.1 fl (ref 78.0–100.0)
MONO ABS: 0.5 10*3/uL (ref 0.1–1.0)
Monocytes Relative: 4.8 % (ref 3.0–12.0)
Neutro Abs: 7.8 10*3/uL — ABNORMAL HIGH (ref 1.4–7.7)
Neutrophils Relative %: 77 % (ref 43.0–77.0)
PLATELETS: 252 10*3/uL (ref 150.0–400.0)
RBC: 4.32 Mil/uL (ref 3.87–5.11)
RDW: 13.6 % (ref 11.5–15.5)
WBC: 10.2 10*3/uL (ref 4.0–10.5)

## 2015-11-25 LAB — IBC PANEL
IRON: 51 ug/dL (ref 42–145)
Saturation Ratios: 13.8 % — ABNORMAL LOW (ref 20.0–50.0)
TRANSFERRIN: 264 mg/dL (ref 212.0–360.0)

## 2015-11-25 LAB — FERRITIN: FERRITIN: 38.8 ng/mL (ref 10.0–291.0)

## 2016-04-01 ENCOUNTER — Other Ambulatory Visit: Payer: Self-pay | Admitting: Internal Medicine

## 2016-04-02 ENCOUNTER — Other Ambulatory Visit: Payer: Self-pay | Admitting: Internal Medicine

## 2016-04-07 ENCOUNTER — Other Ambulatory Visit: Payer: Self-pay | Admitting: *Deleted

## 2016-04-07 DIAGNOSIS — I10 Essential (primary) hypertension: Secondary | ICD-10-CM

## 2016-04-08 MED ORDER — LISINOPRIL-HYDROCHLOROTHIAZIDE 20-25 MG PO TABS: 1.0000 | ORAL_TABLET | Freq: Every day | ORAL | 11 refills | Status: DC

## 2016-06-18 ENCOUNTER — Other Ambulatory Visit: Payer: Self-pay | Admitting: *Deleted

## 2016-06-18 DIAGNOSIS — K219 Gastro-esophageal reflux disease without esophagitis: Secondary | ICD-10-CM

## 2016-06-18 NOTE — Telephone Encounter (Signed)
Patient hasn't been seen in this office in over 2 years. We should not of been refilling her medications (I am guilty of this).   She will need to be seen for any and all medication refills.  Her case should be discussed before making an appointment as she technically does not qualify as a patient of ours with over 2 years absence from this office.

## 2016-07-01 ENCOUNTER — Other Ambulatory Visit: Payer: Self-pay | Admitting: *Deleted

## 2016-07-01 DIAGNOSIS — K219 Gastro-esophageal reflux disease without esophagitis: Secondary | ICD-10-CM

## 2016-07-01 NOTE — Telephone Encounter (Signed)
Patient hasn't been seen in >86yrs. Needs appt.  No refill provided.

## 2016-07-19 ENCOUNTER — Other Ambulatory Visit: Payer: Self-pay | Admitting: *Deleted

## 2016-07-19 DIAGNOSIS — K219 Gastro-esophageal reflux disease without esophagitis: Secondary | ICD-10-CM

## 2016-07-21 NOTE — Telephone Encounter (Signed)
Patient has not been seen in our clinic in >24yrs. She MUST be seen for any and all refills.

## 2016-08-03 ENCOUNTER — Other Ambulatory Visit: Payer: Self-pay | Admitting: Family Medicine

## 2016-08-03 DIAGNOSIS — Z1231 Encounter for screening mammogram for malignant neoplasm of breast: Secondary | ICD-10-CM

## 2016-08-31 ENCOUNTER — Other Ambulatory Visit: Payer: Self-pay | Admitting: Internal Medicine

## 2016-09-05 ENCOUNTER — Other Ambulatory Visit: Payer: Self-pay | Admitting: Internal Medicine

## 2016-09-07 ENCOUNTER — Ambulatory Visit
Admission: RE | Admit: 2016-09-07 | Discharge: 2016-09-07 | Disposition: A | Discharge status: Home / Self Care | Payer: Medicare Other | Source: Ambulatory Visit | Attending: Family Medicine | Admitting: Family Medicine

## 2016-09-07 DIAGNOSIS — Z1231 Encounter for screening mammogram for malignant neoplasm of breast: Secondary | ICD-10-CM

## 2016-09-17 ENCOUNTER — Telehealth: Payer: Self-pay | Admitting: Family Medicine

## 2016-09-17 NOTE — Telephone Encounter (Signed)
Patient had been meaning to call in to schedule an appointment. Requested refills for her iron and acid reflux prescriptions. Patient has had the flu shot and a recent mammogram. Appointment scheduled for 4/17, 2:30pm.

## 2016-10-18 ENCOUNTER — Telehealth: Payer: Self-pay | Admitting: Family Medicine

## 2016-10-18 NOTE — Telephone Encounter (Signed)
Call could not be completed. 

## 2016-10-19 ENCOUNTER — Encounter: Payer: Self-pay | Admitting: Family Medicine

## 2016-10-19 ENCOUNTER — Ambulatory Visit (INDEPENDENT_AMBULATORY_CARE_PROVIDER_SITE_OTHER): Payer: Medicare Other | Admitting: Family Medicine

## 2016-10-19 ENCOUNTER — Encounter: Payer: Medicare Other | Admitting: Family Medicine

## 2016-10-19 VITALS — BP 140/80 | HR 65 | Temp 98.5°F | Ht 62.0 in | Wt 124.0 lb

## 2016-10-19 DIAGNOSIS — K219 Gastro-esophageal reflux disease without esophagitis: Secondary | ICD-10-CM | POA: Diagnosis not present

## 2016-10-19 DIAGNOSIS — Z23 Encounter for immunization: Secondary | ICD-10-CM | POA: Diagnosis not present

## 2016-10-19 DIAGNOSIS — D509 Iron deficiency anemia, unspecified: Secondary | ICD-10-CM | POA: Diagnosis not present

## 2016-10-19 DIAGNOSIS — Z79899 Other long term (current) drug therapy: Secondary | ICD-10-CM

## 2016-10-19 MED ORDER — OMEPRAZOLE 40 MG PO CPDR: DELAYED_RELEASE_CAPSULE | ORAL | 3 refills | Status: DC

## 2016-10-19 MED ORDER — FERROUS SULFATE 325 (65 FE) MG PO TABS: ORAL_TABLET | ORAL | 3 refills | Status: DC

## 2016-10-19 MED ORDER — ASPIRIN EC 81 MG PO TBEC: 81.0000 mg | DELAYED_RELEASE_TABLET | Freq: Every day | ORAL | 0 refills | Status: DC

## 2016-10-19 NOTE — Assessment & Plan Note (Signed)
Worse: Patient is here endorsing worsening symptoms of GERD. She states that she has not taken any prescribed PPIs for multiple months but has been taking over-the-counter PPI (20 mg Prilosec) daily. Symptoms are not debilitating but are fairly consistent. - Omeprazole 40 mg daily.

## 2016-10-19 NOTE — Patient Instructions (Signed)
It was a pleasure seeing you today in our clinic. Today we discussed your medications and blood work. Here is the treatment plan we have discussed and agreed upon together:   - I've refilled your omeprazole. - I will mail you the results of your lab work next week. - Come back and see [place] for a health maintenance visit in about 6 months.

## 2016-10-19 NOTE — Progress Notes (Signed)
   HPI  CC: GERD Patient is here complaining of symptoms of GERD. She states that she has had this issue for many years and had been on a PPI in the past. She now takes a similar PPI over-the-counter and would like to go back to the higher strength prescribed medication. She describes the pain as burning and located just below the rib cage. Worse first thing in the morning, and shortly after mealtime. She denies any nausea or vomiting. No diarrhea. No abdominal pain. No dysphagia, chest pain, or shortness of breath. She endorses some fatigue. No orthostatic symptoms. No syncope or presyncopal symptoms. Denies fever or chills.   Patient states that she takes a daily baby aspirin, but denies any OTC NSAID use.   Review of Systems See HPI for ROS.   CC, SH/smoking status, and VS noted  Objective: BP 140/80 (BP Location: Right Arm, Patient Position: Sitting, Cuff Size: Normal)   Pulse 65   Temp 98.5 F (36.9 C) (Oral)   Ht 5\' 2"  (1.575 m)   Wt 124 lb (56.2 kg)   SpO2 99%   BMI 22.68 kg/m  Gen: NAD, alert, cooperative, and pleasant. HEENT: NCAT, EOMI, PERRL, MMM, neck full ROM, no LAD, OP clear CV: RRR, no murmur Resp: CTAB, no wheezes, non-labored Abd: SNTND, BS present, no guarding or organomegaly Ext: No edema, warm Neuro: Alert and oriented, Speech clear, No gross deficits   Assessment and plan:  GERD (gastroesophageal reflux disease) Worse: Patient is here endorsing worsening symptoms of GERD. She states that she has not taken any prescribed PPIs for multiple months but has been taking over-the-counter PPI (20 mg Prilosec) daily. Symptoms are not debilitating but are fairly consistent. - Omeprazole 40 mg daily.  Microcytic anemia Patient had endorsed some fatigue. It is noted in her history that she has dealt with microcytic anemia in the past. Denies any orthostatic symptoms. Blood pressure appears to be well controlled. - CBC, BMP, TSH   Orders Placed This Encounter    Procedures  . Pneumococcal polysaccharide vaccine 23-valent greater than or equal to 2yo subcutaneous/IM  . CBC  . Basic Metabolic Panel  . TSH  . Lipid panel    Meds ordered this encounter  Medications  . aspirin EC 81 MG tablet    Sig: Take 1 tablet (81 mg total) by mouth daily.    Dispense:  30 tablet    Refill:  0  . ferrous sulfate 325 (65 FE) MG tablet    Sig: take 1 tablet by mouth three times a day with food    Dispense:  90 tablet    Refill:  3  . omeprazole (PRILOSEC) 40 MG capsule    Sig: take 1 capsule by mouth once daily for 2 weeks then daily if needed for REFLUX    Dispense:  90 capsule    Refill:  3     Elberta Leatherwood, MD,MS,  PGY3 10/19/2016 4:47 PM

## 2016-10-19 NOTE — Assessment & Plan Note (Signed)
Patient had endorsed some fatigue. It is noted in her history that she has dealt with microcytic anemia in the past. Denies any orthostatic symptoms. Blood pressure appears to be well controlled. - CBC, BMP, TSH

## 2016-10-20 LAB — BASIC METABOLIC PANEL
BUN/Creatinine Ratio: 23 (ref 12–28)
BUN: 21 mg/dL (ref 8–27)
CALCIUM: 10.3 mg/dL (ref 8.7–10.3)
CO2: 27 mmol/L (ref 18–29)
Chloride: 98 mmol/L (ref 96–106)
Creatinine, Ser: 0.92 mg/dL (ref 0.57–1.00)
GFR calc Af Amer: 72 mL/min/{1.73_m2} (ref >59)
GFR calc non Af Amer: 62 mL/min/{1.73_m2} (ref >59)
GLUCOSE: 82 mg/dL (ref 65–99)
POTASSIUM: 3.6 mmol/L (ref 3.5–5.2)
SODIUM: 142 mmol/L (ref 134–144)

## 2016-10-20 LAB — CBC
HEMOGLOBIN: 12.7 g/dL (ref 11.1–15.9)
Hematocrit: 38.9 % (ref 34.0–46.6)
MCH: 28.5 pg (ref 26.6–33.0)
MCHC: 32.6 g/dL (ref 31.5–35.7)
MCV: 87 fL (ref 79–97)
PLATELETS: 320 10*3/uL (ref 150–379)
RBC: 4.45 x10E6/uL (ref 3.77–5.28)
RDW: 13.9 % (ref 12.3–15.4)
WBC: 6.4 10*3/uL (ref 3.4–10.8)

## 2016-10-20 LAB — LIPID PANEL
CHOLESTEROL TOTAL: 219 mg/dL — AB (ref 100–199)
Chol/HDL Ratio: 2.3 ratio (ref 0.0–4.4)
HDL: 96 mg/dL (ref >39)
LDL Calculated: 113 mg/dL — ABNORMAL HIGH (ref 0–99)
TRIGLYCERIDES: 48 mg/dL (ref 0–149)
VLDL Cholesterol Cal: 10 mg/dL (ref 5–40)

## 2016-10-20 LAB — TSH: TSH: 1.44 u[IU]/mL (ref 0.450–4.500)

## 2016-10-28 ENCOUNTER — Telehealth: Payer: Self-pay | Admitting: Family Medicine

## 2016-10-28 MED ORDER — ATORVASTATIN CALCIUM 20 MG PO TABS: 20.0000 mg | ORAL_TABLET | Freq: Every day | ORAL | 3 refills | Status: DC

## 2016-10-28 NOTE — Telephone Encounter (Signed)
Called pt to discuss lipid level. ASCVD score 33% if smoker, 18% if non-smoker. Would benefit from a statin.  Rx for lipitor. Discussed lifestyle modifications, pt voiced appreciation.  Archie Patten, MD [facility] Family Medicine Resident  10/28/2016, 1:10 PM

## 2017-03-06 ENCOUNTER — Other Ambulatory Visit: Payer: Self-pay | Admitting: Family Medicine

## 2017-04-02 ENCOUNTER — Other Ambulatory Visit: Payer: Self-pay | Admitting: Family Medicine

## 2017-04-11 ENCOUNTER — Other Ambulatory Visit: Payer: Self-pay | Admitting: Family Medicine

## 2017-04-11 DIAGNOSIS — I10 Essential (primary) hypertension: Secondary | ICD-10-CM

## 2017-04-13 ENCOUNTER — Other Ambulatory Visit: Payer: Self-pay | Admitting: Internal Medicine

## 2017-04-13 DIAGNOSIS — I10 Essential (primary) hypertension: Secondary | ICD-10-CM

## 2017-04-13 MED ORDER — LISINOPRIL-HYDROCHLOROTHIAZIDE 20-25 MG PO TABS
1.0000 | ORAL_TABLET | Freq: Every day | ORAL | 11 refills | Status: DC
Start: 2017-04-13 — End: 2018-01-13

## 2017-04-13 NOTE — Telephone Encounter (Signed)
Received fax request for lisinopril-hctz refill from [facility]. Patient with normal BMP this spring. Placed order for refills.

## 2017-07-28 ENCOUNTER — Other Ambulatory Visit: Payer: Self-pay | Admitting: Internal Medicine

## 2017-07-28 ENCOUNTER — Other Ambulatory Visit: Payer: Self-pay | Admitting: Family Medicine

## 2017-07-28 DIAGNOSIS — Z1231 Encounter for screening mammogram for malignant neoplasm of breast: Secondary | ICD-10-CM

## 2017-08-30 ENCOUNTER — Other Ambulatory Visit: Payer: Self-pay

## 2017-08-30 DIAGNOSIS — K219 Gastro-esophageal reflux disease without esophagitis: Secondary | ICD-10-CM

## 2017-08-30 MED ORDER — OMEPRAZOLE 40 MG PO CPDR: 40.0000 mg | DELAYED_RELEASE_CAPSULE | Freq: Every day | ORAL | 0 refills | Status: DC

## 2017-09-09 ENCOUNTER — Ambulatory Visit
Admission: RE | Admit: 2017-09-09 | Discharge: 2017-09-09 | Disposition: A | Discharge status: Home / Self Care | Payer: Medicare Other | Source: Ambulatory Visit | Attending: Family Medicine | Admitting: Family Medicine

## 2017-09-09 DIAGNOSIS — Z1231 Encounter for screening mammogram for malignant neoplasm of breast: Secondary | ICD-10-CM

## 2017-10-20 ENCOUNTER — Other Ambulatory Visit: Payer: Self-pay | Admitting: Internal Medicine

## 2017-10-20 DIAGNOSIS — K219 Gastro-esophageal reflux disease without esophagitis: Secondary | ICD-10-CM

## 2017-10-24 ENCOUNTER — Other Ambulatory Visit: Payer: Self-pay

## 2017-10-24 MED ORDER — ATORVASTATIN CALCIUM 20 MG PO TABS
20.0000 mg | ORAL_TABLET | Freq: Every day | ORAL | 0 refills | Status: DC
Start: 2017-10-24 — End: 2018-01-13

## 2018-01-10 ENCOUNTER — Other Ambulatory Visit: Payer: Self-pay | Admitting: Internal Medicine

## 2018-01-10 DIAGNOSIS — K219 Gastro-esophageal reflux disease without esophagitis: Secondary | ICD-10-CM

## 2018-01-13 ENCOUNTER — Other Ambulatory Visit: Payer: Self-pay

## 2018-01-13 ENCOUNTER — Encounter: Payer: Self-pay | Admitting: Family Medicine

## 2018-01-13 ENCOUNTER — Ambulatory Visit (INDEPENDENT_AMBULATORY_CARE_PROVIDER_SITE_OTHER): Payer: Medicare Other | Admitting: Family Medicine

## 2018-01-13 VITALS — BP 110/60 | HR 64 | Temp 98.0°F | Wt 120.0 lb

## 2018-01-13 DIAGNOSIS — E785 Hyperlipidemia, unspecified: Secondary | ICD-10-CM | POA: Diagnosis not present

## 2018-01-13 DIAGNOSIS — K219 Gastro-esophageal reflux disease without esophagitis: Secondary | ICD-10-CM | POA: Diagnosis not present

## 2018-01-13 DIAGNOSIS — I10 Essential (primary) hypertension: Secondary | ICD-10-CM

## 2018-01-13 DIAGNOSIS — Z1321 Encounter for screening for nutritional disorder: Secondary | ICD-10-CM | POA: Diagnosis not present

## 2018-01-13 MED ORDER — LISINOPRIL-HYDROCHLOROTHIAZIDE 20-25 MG PO TABS: 1.0000 | ORAL_TABLET | Freq: Every day | ORAL | 11 refills | Status: DC

## 2018-01-13 MED ORDER — FERROUS SULFATE 325 (65 FE) MG PO TABS: ORAL_TABLET | ORAL | 3 refills | Status: DC

## 2018-01-13 MED ORDER — OMEPRAZOLE 40 MG PO CPDR: 40.0000 mg | DELAYED_RELEASE_CAPSULE | Freq: Every day | ORAL | 0 refills | Status: DC

## 2018-01-13 MED ORDER — ATORVASTATIN CALCIUM 20 MG PO TABS: 20.0000 mg | ORAL_TABLET | Freq: Every day | ORAL | 0 refills | Status: DC

## 2018-01-13 MED ORDER — POLYETHYLENE GLYCOL 3350 17 G PO PACK: 17.0000 g | PACK | Freq: Every day | ORAL | 3 refills | Status: AC | PRN

## 2018-01-13 NOTE — Patient Instructions (Signed)

## 2018-01-13 NOTE — Assessment & Plan Note (Signed)
Well-controlled on lisinopril/HCTZ 20/25.  Will draw routine labs CBC, BMP, lipid profile to optimize.

## 2018-01-13 NOTE — Telephone Encounter (Signed)
LMOVM for pt to call back and make an appt.Deseree Blount, CMA  

## 2018-01-13 NOTE — Progress Notes (Signed)
Subjective:   HPI: 72 y.o. year old female presents for well woman/preventative visit.   Acute Concerns: Patient declines any  Diet: Associated transient regular fruits and vegetables.  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  Tries to walk as well.  Sexual/Birth History: Postmenopausal.  Surgical History: Past Surgical History:  Procedure Laterality Date  . BREAST BIOPSY  08/09/2011   Procedure: BREAST BIOPSY WITH NEEDLE LOCALIZATION;  Surgeon: Haywood Lasso, MD;  Location: [place];  Service: General;  Laterality: Left;  needle localized at breast center of [employer] left breast biopsy   . BREAST EXCISIONAL BIOPSY Left    benign  . LIVER BIOPSY  2007   liver absess drained  . TUBAL LIGATION      Allergies: Allergies  Allergen Reactions  . Tetracyclines & Related Nausea Only and Other (See Comments)    Made stomach cramp.    Social:  Social History   Socioeconomic History  . Marital status: Single    Spouse name: Not on file  . Number of children: 0  . Years of education: Not on file  . Highest education level: Not on file  Occupational History  . Occupation: [employer]    Employer: UNEMPLOYED  Social Needs  . Financial resource strain: Not on file  . Food insecurity:    Worry: Not on file    Inability: Not on file  . Transportation needs:    Medical: Not on file    Non-medical: Not on file  Tobacco Use  . Smoking status: Former Smoker    Types: Cigarettes    Last attempt to quit: 08/02/1982    Years since quitting: 35.4  . Smokeless tobacco: Never Used  Substance and Sexual Activity  . Alcohol use: No  . Drug use: No  . Sexual activity: Not on file  Lifestyle  . Physical activity:    Days per week: Not on file    Minutes per session: Not on file  . Stress: Not on file  Relationships  . Social connections:    Talks on phone: Not on file    Gets together: Not on file    Attends  religious service: Not on file    Active member of club or organization: Not on file    Attends meetings of clubs or organizations: Not on file    Relationship status: Not on file  Other Topics Concern  . Not on file  Social History Narrative  . Not on file    Immunization: Immunization History  Administered Date(s) Administered  . Influenza, High Dose Seasonal PF 02/17/2015  . Influenza-Unspecified 03/15/2016, 04/26/2017  . Pneumococcal Conjugate-13 11/20/2014  . Pneumococcal Polysaccharide-23 10/19/2016  . Tdap 12/14/2014    Cancer Screening:  Mammogram: Normal in 09/2017  colonoscopy: Last done in 05/2012.  Normal.  Physical Exam: VITALS: Reviewed GEN: Pleasant female, NAD HEENT: Normocephalic, PERRL, EOMI, no scleral icterus, bilateral TM pearly grey, nasal septum midline, MMM, uvula midline, no anterior or posterior lymphadenopathy, no thyromegaly CARDIAC:RRR, S1 and S2 present, no murmur, no heaves/thrills RESP: CTAB, normal effort BREAST:Exam performed in the presence of a chaperone. No masses. No nipple discharge. No axillary lymphadenopathy. ABD: Soft, no tenderness, normal bowel sounds GU/GYN:Exam performed in the presence of a chaperone. Normal external genitalia. Cervix unremarkable. Bimanual exam identified no masses EXT: No edema, 2+ radial and DP pulses SKIN: Warm and dry, no rash  ASSESSMENT & PLAN: 72 y.o.  female presents for annual well woman/preventative exam and GYN exam. Please see problem specific assessment and plan.  Essential hypertension, benign Well-controlled on lisinopril/HCTZ 20/25.  Will draw routine labs CBC, BMP, lipid profile to optimize.  Will check vitamin D as she denied wanting to get DEXA scan and will screen for osteoporosis.  Healthy.  Will refill omeprazole, atorvastatin, HCTZ. Marny Lowenstein, MD, MS FAMILY MEDICINE RESIDENT - PGY2 01/13/2018 5:03 PM

## 2018-01-14 ENCOUNTER — Encounter: Payer: Self-pay | Admitting: Family Medicine

## 2018-01-14 LAB — CBC WITH DIFFERENTIAL/PLATELET
BASOS: 1 %
Basophils Absolute: 0.1 10*3/uL (ref 0.0–0.2)
EOS (ABSOLUTE): 0.2 10*3/uL (ref 0.0–0.4)
EOS: 4 %
HEMATOCRIT: 36.9 % (ref 34.0–46.6)
Hemoglobin: 11.8 g/dL (ref 11.1–15.9)
IMMATURE GRANS (ABS): 0 10*3/uL (ref 0.0–0.1)
IMMATURE GRANULOCYTES: 0 %
LYMPHS: 39 %
Lymphocytes Absolute: 2.3 10*3/uL (ref 0.7–3.1)
MCH: 28.3 pg (ref 26.6–33.0)
MCHC: 32 g/dL (ref 31.5–35.7)
MCV: 89 fL (ref 79–97)
Monocytes Absolute: 0.6 10*3/uL (ref 0.1–0.9)
Monocytes: 10 %
NEUTROS ABS: 2.7 10*3/uL (ref 1.4–7.0)
NEUTROS PCT: 46 %
Platelets: 233 10*3/uL (ref 150–450)
RBC: 4.17 x10E6/uL (ref 3.77–5.28)
RDW: 12.3 % (ref 12.3–15.4)
WBC: 5.8 10*3/uL (ref 3.4–10.8)

## 2018-01-14 LAB — LIPID PANEL
Chol/HDL Ratio: 1.7 ratio (ref 0.0–4.4)
Cholesterol, Total: 149 mg/dL (ref 100–199)
HDL: 88 mg/dL (ref >39)
LDL Calculated: 55 mg/dL (ref 0–99)
Triglycerides: 31 mg/dL (ref 0–149)
VLDL CHOLESTEROL CAL: 6 mg/dL (ref 5–40)

## 2018-01-14 LAB — BASIC METABOLIC PANEL
BUN/Creatinine Ratio: 19 (ref 12–28)
BUN: 17 mg/dL (ref 8–27)
CHLORIDE: 100 mmol/L (ref 96–106)
CO2: 25 mmol/L (ref 20–29)
Calcium: 9.6 mg/dL (ref 8.7–10.3)
Creatinine, Ser: 0.91 mg/dL (ref 0.57–1.00)
GFR calc Af Amer: 72 mL/min/{1.73_m2} (ref >59)
GFR calc non Af Amer: 63 mL/min/{1.73_m2} (ref >59)
Glucose: 95 mg/dL (ref 65–99)
POTASSIUM: 3.8 mmol/L (ref 3.5–5.2)
Sodium: 139 mmol/L (ref 134–144)

## 2018-01-14 LAB — VITAMIN D 25 HYDROXY (VIT D DEFICIENCY, FRACTURES): Vit D, 25-Hydroxy: 80.8 ng/mL (ref 30.0–100.0)

## 2018-01-14 NOTE — Progress Notes (Signed)
Pls send results letter to pt.

## 2018-04-16 ENCOUNTER — Other Ambulatory Visit: Payer: Self-pay | Admitting: Family Medicine

## 2018-04-16 DIAGNOSIS — K219 Gastro-esophageal reflux disease without esophagitis: Secondary | ICD-10-CM

## 2018-04-16 DIAGNOSIS — E785 Hyperlipidemia, unspecified: Secondary | ICD-10-CM

## 2018-04-17 ENCOUNTER — Other Ambulatory Visit: Payer: Self-pay | Admitting: Internal Medicine

## 2018-04-17 DIAGNOSIS — I10 Essential (primary) hypertension: Secondary | ICD-10-CM

## 2018-08-03 ENCOUNTER — Other Ambulatory Visit: Payer: Self-pay | Admitting: Family Medicine

## 2018-08-03 DIAGNOSIS — Z1231 Encounter for screening mammogram for malignant neoplasm of breast: Secondary | ICD-10-CM

## 2018-08-31 ENCOUNTER — Other Ambulatory Visit: Payer: Self-pay | Admitting: Family Medicine

## 2018-08-31 DIAGNOSIS — I10 Essential (primary) hypertension: Secondary | ICD-10-CM

## 2018-09-18 ENCOUNTER — Ambulatory Visit
Admission: RE | Admit: 2018-09-18 | Discharge: 2018-09-18 | Disposition: A | Discharge status: Home / Self Care | Payer: Medicare Other | Source: Ambulatory Visit | Attending: Family Medicine | Admitting: Family Medicine

## 2018-09-18 ENCOUNTER — Other Ambulatory Visit: Payer: Self-pay

## 2018-09-18 DIAGNOSIS — Z1231 Encounter for screening mammogram for malignant neoplasm of breast: Secondary | ICD-10-CM

## 2018-11-28 ENCOUNTER — Other Ambulatory Visit: Payer: Self-pay | Admitting: Family Medicine

## 2018-11-28 DIAGNOSIS — I10 Essential (primary) hypertension: Secondary | ICD-10-CM

## 2019-01-23 ENCOUNTER — Other Ambulatory Visit: Payer: Self-pay | Admitting: Family Medicine

## 2019-01-23 DIAGNOSIS — K219 Gastro-esophageal reflux disease without esophagitis: Secondary | ICD-10-CM

## 2019-04-12 ENCOUNTER — Other Ambulatory Visit: Payer: Self-pay | Admitting: Family Medicine

## 2019-04-12 DIAGNOSIS — E785 Hyperlipidemia, unspecified: Secondary | ICD-10-CM

## 2019-08-10 ENCOUNTER — Other Ambulatory Visit: Payer: Self-pay | Admitting: Family Medicine

## 2019-08-10 DIAGNOSIS — Z1231 Encounter for screening mammogram for malignant neoplasm of breast: Secondary | ICD-10-CM

## 2019-09-19 ENCOUNTER — Ambulatory Visit: Payer: Medicare Other

## 2019-10-09 ENCOUNTER — Other Ambulatory Visit: Payer: Self-pay | Admitting: Family Medicine

## 2019-10-09 DIAGNOSIS — I10 Essential (primary) hypertension: Secondary | ICD-10-CM

## 2019-11-02 ENCOUNTER — Other Ambulatory Visit: Payer: Self-pay

## 2019-11-02 ENCOUNTER — Ambulatory Visit
Admission: RE | Admit: 2019-11-02 | Discharge: 2019-11-02 | Disposition: A | Discharge status: Home / Self Care | Payer: Medicare Other | Source: Ambulatory Visit | Attending: Family Medicine | Admitting: Family Medicine

## 2019-11-02 DIAGNOSIS — Z1231 Encounter for screening mammogram for malignant neoplasm of breast: Secondary | ICD-10-CM

## 2019-11-29 ENCOUNTER — Other Ambulatory Visit: Payer: Self-pay | Admitting: Family Medicine

## 2019-11-29 DIAGNOSIS — K219 Gastro-esophageal reflux disease without esophagitis: Secondary | ICD-10-CM

## 2019-12-12 ENCOUNTER — Ambulatory Visit (INDEPENDENT_AMBULATORY_CARE_PROVIDER_SITE_OTHER): Payer: Medicare Other

## 2019-12-12 DIAGNOSIS — Z0000 Encounter for general adult medical examination without abnormal findings: Secondary | ICD-10-CM

## 2019-12-12 NOTE — Progress Notes (Addendum)
Subjective:   Nicole Robbins is a 74 y.o. female who presents for Medicare Annual (Subsequent) preventive examination.  The patient consented to a virtual visit.  Review of Systems: Defer to PCP.  Cardiac Risk Factors include: advanced age (>23men, >29 women);hypertension  Objective:   Vitals: There were no vitals taken for this visit.  There is no height or weight on file to calculate BMI.  Advanced Directives 12/12/2019 01/13/2018 10/19/2016 05/17/2014 08/04/2011  Does Patient Have a Medical Advance Directive? No No No No Patient does not have advance directive  Would patient like information on creating a medical advance directive? Yes (MAU/Ambulatory/Procedural Areas - Information given) No - Patient declined No - Patient declined No - patient declined information -   Tobacco Social History   Tobacco Use  Smoking Status Former Smoker  . Types: Cigarettes  . Quit date: 08/02/1982  . Years since quitting: 37.4  Smokeless Tobacco Never Used     Counseling given: No plans to restart   Clinical Intake:  Pre-visit preparation completed: Yes  How often do you need to have someone help you when you read instructions, pamphlets, or other written materials from your doctor or pharmacy?: 1 - Never What is the last grade level you completed in school?: College  Past Medical History:  Diagnosis Date  . Anemia   . Breast calcification, right   . Esophagitis   . GERD (gastroesophageal reflux disease)   . Hepatic abscess   . Hepatitis    liver absess07-drained  . Hiatal hernia   . Hypertension   . Hypopotassemia   . Iron deficiency anemia   . Vitamin D deficiency    Past Surgical History:  Procedure Laterality Date  . BREAST BIOPSY  08/09/2011   Procedure: BREAST BIOPSY WITH NEEDLE LOCALIZATION;  Surgeon: Haywood Lasso, MD;  Location: [facility];  Service: General;  Laterality: Left;  needle localized at breast center of [employer] left breast biopsy     . BREAST EXCISIONAL BIOPSY Left    benign  . LIVER BIOPSY  2007   liver absess drained  . TUBAL LIGATION     Family History  Problem Relation Age of Onset  . Heart disease Mother   . Diabetes Mother   . Hypertension Mother   . Lung cancer Father   . Heart disease Brother   . Colon cancer Neg Hx    Social History   Socioeconomic History  . Marital status: Divorced    Spouse name: Not on file  . Number of children: 0  . Years of education: 60  . Highest education level: Associate degree: academic program  Occupational History  . Occupation: Retired  Tobacco Use  . Smoking status: Former Smoker    Types: Cigarettes    Quit date: 08/02/1982    Years since quitting: 37.4  . Smokeless tobacco: Never Used  Substance and Sexual Activity  . Alcohol use: No  . Drug use: No  . Sexual activity: Not Currently  Other Topics Concern  . Not on file  Social History Narrative   Patient lives alone in [place].    Patient is divorced and has no children.    Patient enjoys working out and being active.    Patients wellness is very important to her.    Social Determinants of Health   Financial Resource Strain: Low Risk   . Difficulty of Paying Living Expenses: Not hard at all  Food Insecurity: No Food Insecurity  . Worried  About Running Out of Food in the Last Year: Never true  . Ran Out of Food in the Last Year: Never true  Transportation Needs: No Transportation Needs  . Lack of Transportation (Medical): No  . Lack of Transportation (Non-Medical): No  Physical Activity: Sufficiently Active  . Days of Exercise per Week: 4 days  . Minutes of Exercise per Session: 60 min  Stress: No Stress Concern Present  . Feeling of Stress : Not at all  Social Connections: Moderately Integrated  . Frequency of Communication with Friends and Family: More than three times a week  . Frequency of Social Gatherings with Friends and Family: More than three times a week  . Attends Religious  Services: More than 4 times per year  . Active Member of Clubs or Organizations: Yes  . Attends Archivist Meetings: More than 4 times per year  . Marital Status: Divorced   Outpatient Encounter Medications as of 12/12/2019  Medication Sig  . aspirin EC 81 MG tablet Take 1 tablet (81 mg total) by mouth daily.  Marland Kitchen atorvastatin (LIPITOR) 20 MG tablet TAKE 1 TABLET BY MOUTH EVERY DAY  . cholecalciferol (VITAMIN D) 1000 units tablet Take 2,000 Units by mouth daily.  . ferrous sulfate 325 (65 FE) MG tablet take 1 tablet by mouth three times a day with food  . lisinopril-hydrochlorothiazide (ZESTORETIC) 20-25 MG tablet TAKE 1 TABLET BY MOUTH EVERY DAY  . omeprazole (PRILOSEC) 40 MG capsule TAKE 1 CAPSULE(40 MG) BY MOUTH DAILY  . polyethylene glycol (MIRALAX / GLYCOLAX) packet Take 17 g by mouth daily as needed.   No facility-administered encounter medications on file as of 12/12/2019.   Activities of Daily Living In your present state of health, do you have any difficulty performing the following activities: 12/12/2019  Hearing? N  Vision? N  Difficulty concentrating or making decisions? N  Walking or climbing stairs? N  Dressing or bathing? N  Doing errands, shopping? N  Preparing Food and eating ? N  Using the Toilet? N  In the past six months, have you accidently leaked urine? N  Do you have problems with loss of bowel control? N  Managing your Medications? N  Managing your Finances? N  Housekeeping or managing your Housekeeping? N  Some recent data might be hidden   Patient Care Team: Bonnita Hollow, MD as PCP - General (Family Medicine)    Assessment:   This is a routine wellness examination for Nicole Robbins.  Exercise Activities and Dietary recommendations Current Exercise Habits: Home exercise routine, Type of exercise: treadmill;strength training/weights;yoga, Time (Minutes): 60, Frequency (Times/Week): 4, Weekly Exercise (Minutes/Week): 240, Intensity: Moderate  Goals      . Patient Stated     Maintain quality of life. Patient enjoys working out and blessed she can.       Fall Risk Fall Risk  12/12/2019 01/13/2018 10/19/2016 05/17/2014  Falls in the past year? 0 No No No   Is the patient's home free of loose throw rugs in walkways, pet beds, electrical cords, etc?   yes      Grab bars in the bathroom? no      Handrails on the stairs?   yes      Adequate lighting?   yes  Patient rating of health (0-10) scale: 9   Depression Screen PHQ 2/9 Scores 12/12/2019 01/13/2018 10/19/2016 05/17/2014  PHQ - 2 Score 0 0 0 0    Cognitive Function  6CIT Screen 12/12/2019  What Year? 0  points  What month? 0 points  What time? 0 points  Count back from 20 2 points  Months in reverse 0 points  Repeat phrase 0 points  Total Score 2   Immunization History  Administered Date(s) Administered  . Fluad Quad(high Dose 65+) 03/27/2019  . Influenza, High Dose Seasonal PF 02/17/2015, 04/16/2018  . Influenza-Unspecified 03/15/2016, 04/26/2017, 04/16/2018  . Moderna SARS-COVID-2 Vaccination 08/17/2019, 09/14/2019  . Pneumococcal Conjugate-13 11/20/2014  . Pneumococcal Polysaccharide-23 10/19/2016  . Tdap 12/14/2014   Screening Tests Health Maintenance  Topic Date Due  . DEXA SCAN  Never done  . INFLUENZA VACCINE  02/03/2020  . COLONOSCOPY  05/15/2022  . TETANUS/TDAP  12/13/2024  . COVID-19 Vaccine  Completed  . Hepatitis C Screening  Completed  . PNA vac Low Risk Adult  Completed    Cancer Screenings: Lung: Low Dose CT Chest recommended if Age 61-80 years, 30 pack-year currently smoking OR have quit w/in 15years. Patient does not qualify. Breast:  Up to date on Mammogram? Yes   Up to date of Bone Density/Dexa? No Colorectal: UTD  Additional Screenings: Hepatitis C Screening: Completed   Plan:  Keep up the good work staying active! Schedule an apt with your PCP soon, you have not been seen since 2019. You can schedule a Dexa scan with your next Mammogram  apt.  I have personally reviewed and noted the following in the patient's chart:   . Medical and social history . Use of alcohol, tobacco or illicit drugs  . Current medications and supplements . Functional ability and status . Nutritional status . Physical activity . Advanced directives . List of other physicians . Hospitalizations, surgeries, and ER visits in previous 12 months . Vitals . Screenings to include cognitive, depression, and falls . Referrals and appointments  In addition, I have reviewed and discussed with patient certain preventive protocols, quality metrics, and best practice recommendations. A written personalized care plan for preventive services as well as general preventive health recommendations were provided to patient.  This visit was conducted virtually in the setting of the [place] pandemic.    Dorna Bloom, [place]  12/19/2019   I have reviewed this visit and agree with the documentation.   Marny Lowenstein, MD, MS FAMILY MEDICINE RESIDENT - PGY3 12/19/2019 3:36 PM

## 2019-12-19 NOTE — Patient Instructions (Signed)
You spoke to Nicole Robbins, [place] over the phone for your annual wellness visit.  We discussed goals: Goals     Patient Stated     Maintain quality of life. Patient enjoys working out and blessed she can.       We also discussed recommended health maintenance. Please call our office and schedule a visit. As discussed, you are up to date with pretty much everything. Schedule your Dexa scan with your next Mammogram visit.  Health Maintenance  Topic Date Due   DEXA SCAN  Never done   INFLUENZA VACCINE  02/03/2020   COLONOSCOPY  05/15/2022   TETANUS/TDAP  12/13/2024   COVID-19 Vaccine  Completed   Hepatitis C Screening  Completed   PNA vac Low Risk Adult  Completed   Keep up the good work staying active! Schedule an apt with your PCP soon, you have not been seen since 2019. You can schedule a Dexa scan with your next Mammogram apt.  Preventive Care 75 Years and Older, Female Preventive care refers to lifestyle choices and visits with your health care provider that can promote health and wellness. This includes:  A yearly physical exam. This is also called an annual well check.  Regular dental and eye exams.  Immunizations.  Screening for certain conditions.  Healthy lifestyle choices, such as diet and exercise. What can I expect for my preventive care visit? Physical exam Your health care provider will check:  Height and weight. These may be used to calculate body mass index (BMI), which is a measurement that tells if you are at a healthy weight.  Heart rate and blood pressure.  Your skin for abnormal spots. Counseling Your health care provider may ask you questions about:  Alcohol, tobacco, and drug use.  Emotional well-being.  Home and relationship well-being.  Sexual activity.  Eating habits.  History of falls.  Memory and ability to understand (cognition).  Work and work Statistician.  Pregnancy and menstrual history. What immunizations do I  need?  Influenza (flu) vaccine  This is recommended every year. Tetanus, diphtheria, and pertussis (Tdap) vaccine  You may need a Td booster every 10 years. Varicella (chickenpox) vaccine  You may need this vaccine if you have not already been vaccinated. Zoster (shingles) vaccine  You may need this after age 2. Pneumococcal conjugate (PCV13) vaccine  One dose is recommended after age 75. Pneumococcal polysaccharide (PPSV23) vaccine  One dose is recommended after age 75. Measles, mumps, and rubella (MMR) vaccine  You may need at least one dose of MMR if you were born in 1957 or later. You may also need a second dose. Meningococcal conjugate (MenACWY) vaccine  You may need this if you have certain conditions. Hepatitis A vaccine  You may need this if you have certain conditions or if you travel or work in places where you may be exposed to hepatitis A. Hepatitis B vaccine  You may need this if you have certain conditions or if you travel or work in places where you may be exposed to hepatitis B. Haemophilus influenzae type b (Hib) vaccine  You may need this if you have certain conditions. You may receive vaccines as individual doses or as more than one vaccine together in one shot (combination vaccines). Talk with your health care provider about the risks and benefits of combination vaccines. What tests do I need? Blood tests  Lipid and cholesterol levels. These may be checked every 5 years, or more frequently depending on your overall  health.  Hepatitis C test.  Hepatitis B test. Screening  Lung cancer screening. You may have this screening every year starting at age 75 if you have a 30-pack-year history of smoking and currently smoke or have quit within the past 15 years.  Colorectal cancer screening. All adults should have this screening starting at age 75 and continuing until age 9. Your health care provider may recommend screening at age 29 if you are at  increased risk. You will have tests every 1-10 years, depending on your results and the type of screening test.  Diabetes screening. This is done by checking your blood sugar (glucose) after you have not eaten for a while (fasting). You may have this done every 1-3 years.  Mammogram. This may be done every 1-2 years. Talk with your health care provider about how often you should have regular mammograms.  BRCA-related cancer screening. This may be done if you have a family history of breast, ovarian, tubal, or peritoneal cancers. Other tests  Sexually transmitted disease (STD) testing.  Bone density scan. This is done to screen for osteoporosis. You may have this done starting at age 75. Follow these instructions at home: Eating and drinking  Eat a diet that includes fresh fruits and vegetables, whole grains, lean protein, and low-fat dairy products. Limit your intake of foods with high amounts of sugar, saturated fats, and salt.  Take vitamin and mineral supplements as recommended by your health care provider.  Do not drink alcohol if your health care provider tells you not to drink.  If you drink alcohol: ? Limit how much you have to 0-1 drink a day. ? Be aware of how much alcohol is in your drink. In the [place] one drink equals one 12 oz bottle of beer (355 mL), one 5 oz glass of wine (148 mL), or one 1 oz glass of hard liquor (44 mL). Lifestyle  Take daily care of your teeth and gums.  Stay active. Exercise for at least 30 minutes on 5 or more days each week.  Do not use any products that contain nicotine or tobacco, such as cigarettes, e-cigarettes, and chewing tobacco. If you need help quitting, ask your health care provider.  If you are sexually active, practice safe sex. Use a condom or other form of protection in order to prevent STIs (sexually transmitted infections).  Talk with your health care provider about taking a low-dose aspirin or statin. What's next?  Go to your  health care provider once a year for a well check visit.  Ask your health care provider how often you should have your eyes and teeth checked.  Stay up to date on all vaccines. This information is not intended to replace advice given to you by your health care provider. Make sure you discuss any questions you have with your health care provider. Document Revised: 06/15/2018 Document Reviewed: 06/15/2018 Elsevier Patient Education  2020 [place].  Bone Density Test The bone density test uses a special type of X-ray to measure the amount of calcium and other minerals in your bones. It can measure bone density in the hip and the spine. The test procedure is similar to having a regular X-ray. This test may also be called:  Bone densitometry.  Bone mineral density test.  Dual-energy X-ray absorptiometry (DEXA). You may have this test to:  Diagnose a condition that causes weak or thin bones (osteoporosis).  Screen you for osteoporosis.  Predict your risk for a broken bone (fracture).  Determine how well your osteoporosis treatment is working. Tell a health care provider about:  Any allergies you have.  All medicines you are taking, including vitamins, herbs, eye drops, creams, and over-the-counter medicines.  Any problems you or family members have had with anesthetic medicines.  Any blood disorders you have.  Any surgeries you have had.  Any medical conditions you have.  Whether you are pregnant or may be pregnant.  Any medical tests you have had within the past 14 days that used contrast material. What are the risks? Generally, this is a safe procedure. However, it does expose you to a small amount of radiation, which can slightly increase your cancer risk. What happens before the procedure?  Do not take any calcium supplements starting 24 hours before your test.  Remove all metal jewelry, eyeglasses, dental appliances, and any other metal objects. What happens  during the procedure?   You will lie down on an exam table. There will be an X-ray generator below you and an imaging device above you.  Other devices, such as boxes or braces, may be used to position your body properly for the scan.  The machine will slowly scan your body. You will need to keep still.  The images will show up on a screen in the room. Images will be examined by a specialist after your test is done. The procedure may vary among health care providers and hospitals. What happens after the procedure?  It is up to you to get your test results. Ask your health care provider, or the department that is doing the test, when your results will be ready. Summary  A bone density test is an imaging test that uses a type of X-ray to measure the amount of calcium and other minerals in your bones.  The test may be used to diagnose or screen you for a condition that causes weak or thin bones (osteoporosis), predict your risk for a broken bone (fracture), or determine how well your osteoporosis treatment is working.  Do not take any calcium supplements starting 24 hours before your test.  Ask your health care provider, or the department that is doing the test, when your results will be ready. This information is not intended to replace advice given to you by your health care provider. Make sure you discuss any questions you have with your health care provider. Document Revised: 07/07/2017 Document Reviewed: 04/25/2017 Elsevier Patient Education  2020 [place] [facility]'s number is [phone]. Please call with questions or concerns about what we discussed today.

## 2020-08-12 ENCOUNTER — Ambulatory Visit (INDEPENDENT_AMBULATORY_CARE_PROVIDER_SITE_OTHER): Payer: Medicare Other | Admitting: Family Medicine

## 2020-08-12 ENCOUNTER — Encounter: Payer: Self-pay | Admitting: Family Medicine

## 2020-08-12 ENCOUNTER — Other Ambulatory Visit: Payer: Self-pay

## 2020-08-12 VITALS — BP 120/66 | HR 67 | Ht 62.0 in | Wt 121.8 lb

## 2020-08-12 DIAGNOSIS — I10 Essential (primary) hypertension: Secondary | ICD-10-CM

## 2020-08-12 DIAGNOSIS — Z0000 Encounter for general adult medical examination without abnormal findings: Secondary | ICD-10-CM

## 2020-08-12 DIAGNOSIS — E782 Mixed hyperlipidemia: Secondary | ICD-10-CM

## 2020-08-12 DIAGNOSIS — E785 Hyperlipidemia, unspecified: Secondary | ICD-10-CM | POA: Insufficient documentation

## 2020-08-12 DIAGNOSIS — D509 Iron deficiency anemia, unspecified: Secondary | ICD-10-CM

## 2020-08-12 DIAGNOSIS — Z862 Personal history of diseases of the blood and blood-forming organs and certain disorders involving the immune mechanism: Secondary | ICD-10-CM

## 2020-08-12 NOTE — Assessment & Plan Note (Signed)
History of microcytic anemia, most recent CBC wnl. - repeat CBC

## 2020-08-12 NOTE — Assessment & Plan Note (Addendum)
Most recent lipid panel at goal. Stopped taking atorvastatin 3 months ago because she could not get any additional refills. LDL goal 100, no evidence of CAD history seen in chart. - lipid panel - consider restarting atorvastatin pending results

## 2020-08-12 NOTE — Assessment & Plan Note (Addendum)
Well-controlled on current regimen of lisinopril-HCTZ 20-25 mg. Reports good adherence. Continue current regimen. - BMP

## 2020-08-12 NOTE — Patient Instructions (Addendum)
It was nice seeing you today!  Keep up the good work staying healthy. I will forward all the paperwork to the dental surgeon once the blood tests are back. I'd like to see you in 1 year for your next physical.  Stay well, Nicole Button, MD [place] [phone]    Health Maintenance After Age 76 After age 11, you are at a higher risk for certain long-term diseases and infections as well as injuries from falls. Falls are a major cause of broken bones and head injuries in people who are older than age 36. Getting regular preventive care can help to keep you healthy and well. Preventive care includes getting regular testing and making lifestyle changes as recommended by your health care provider. Talk with your health care provider about:  Which screenings and tests you should have. A screening is a test that checks for a disease when you have no symptoms.  A diet and exercise plan that is right for you. What should I know about screenings and tests to prevent falls? Screening and testing are the best ways to find a health problem early. Early diagnosis and treatment give you the best chance of managing medical conditions that are common after age 68. Certain conditions and lifestyle choices may make you more likely to have a fall. Your health care provider may recommend:  Regular vision checks. Poor vision and conditions such as cataracts can make you more likely to have a fall. If you wear glasses, make sure to get your prescription updated if your vision changes.  Medicine review. Work with your health care provider to regularly review all of the medicines you are taking, including over-the-counter medicines. Ask your health care provider about any side effects that may make you more likely to have a fall. Tell your health care provider if any medicines that you take make you feel dizzy or sleepy.  Osteoporosis screening. Osteoporosis is a condition that causes the  bones to get weaker. This can make the bones weak and cause them to break more easily.  Blood pressure screening. Blood pressure changes and medicines to control blood pressure can make you feel dizzy.  Strength and balance checks. Your health care provider may recommend certain tests to check your strength and balance while standing, walking, or changing positions.  Foot health exam. Foot pain and numbness, as well as not wearing proper footwear, can make you more likely to have a fall.  Depression screening. You may be more likely to have a fall if you have a fear of falling, feel emotionally low, or feel unable to do activities that you used to do.  Alcohol use screening. Using too much alcohol can affect your balance and may make you more likely to have a fall. What actions can I take to lower my risk of falls? General instructions  Talk with your health care provider about your risks for falling. Tell your health care provider if: ? You fall. Be sure to tell your health care provider about all falls, even ones that seem minor. ? You feel dizzy, sleepy, or off-balance.  Take over-the-counter and prescription medicines only as told by your health care provider. These include any supplements.  Eat a healthy diet and maintain a healthy weight. A healthy diet includes low-fat dairy products, low-fat (lean) meats, and fiber from whole grains, beans, and lots of fruits and vegetables. Home safety  Remove any tripping hazards, such as rugs, cords, and clutter.  Install safety equipment such as grab bars in bathrooms and safety rails on stairs.  Keep rooms and walkways well-lit. Activity  Follow a regular exercise program to stay fit. This will help you maintain your balance. Ask your health care provider what types of exercise are appropriate for you.  If you need a cane or walker, use it as recommended by your health care provider.  Wear supportive shoes that have nonskid soles.    Lifestyle  Do not drink alcohol if your health care provider tells you not to drink.  If you drink alcohol, limit how much you have: ? 0-1 drink a day for women. ? 0-2 drinks a day for men.  Be aware of how much alcohol is in your drink. In the [place] one drink equals one typical bottle of beer (12 oz), one-half glass of wine (5 oz), or one shot of hard liquor (1 oz).  Do not use any products that contain nicotine or tobacco, such as cigarettes and e-cigarettes. If you need help quitting, ask your health care provider. Summary  Having a healthy lifestyle and getting preventive care can help to protect your health and wellness after age 71.  Screening and testing are the best way to find a health problem early and help you avoid having a fall. Early diagnosis and treatment give you the best chance for managing medical conditions that are more common for people who are older than age 43.  Falls are a major cause of broken bones and head injuries in people who are older than age 70. Take precautions to prevent a fall at home.  Work with your health care provider to learn what changes you can make to improve your health and wellness and to prevent falls. This information is not intended to replace advice given to you by your health care provider. Make sure you discuss any questions you have with your health care provider. Document Revised: 10/12/2018 Document Reviewed: 05/04/2017 Elsevier Patient Education  2021 [employer].

## 2020-08-12 NOTE — Progress Notes (Signed)
    SUBJECTIVE:   CHIEF COMPLAINT / HPI: Physical, pre-operative evaluation  No concerns today. Patient is to have a dental procedure and needs pre-operative evaluation. Home: lives by herself, family in [place]: avoids eating out, cooks own food, eats plenty of fruits and vegetables Exercise: workout at home (aerobics, bootcamps) 4-5 days/week, active at home Substances: Denies alcohol and recreational drug use. Smoked 1 pack in her lifetime decades ago. Functional status: performs all IADLs without any issues (telephone, shopping, preparing, food, housekeeping, laundry, transportation, medications, finances) Can climb stairs without any issues  States had a stress test several years ago which was normal.  PERTINENT  PMH / PSH: HTN, GERD  OBJECTIVE:   BP 120/66   Pulse 67   Ht 5\' 2"  (1.575 m)   Wt 121 lb 12.8 oz (55.2 kg)   SpO2 99%   BMI 22.28 kg/m   General: Healthy-appearing elderly woman, NAD Eyes: PERRL, EOMI HEENT: MMM, missing several teeth Neck: supple, no LAD, no thyromegaly CV: RRR, no murmurs Pulm: CTAB, no wheezes or rales, good air movement Abd: soft, non-tender, +BS Neuro: 5/5 strength all extremities  ASSESSMENT/PLAN:   Hyperlipidemia Most recent lipid panel at goal. Stopped taking atorvastatin 3 months ago because she could not get any additional refills. LDL goal 100, no evidence of CAD history seen in chart. - lipid panel - consider restarting atorvastatin pending results  Essential hypertension, benign Well-controlled on current regimen of lisinopril-HCTZ 20-25 mg. Reports good adherence. Continue current regimen. - BMP  Microcytic anemia History of microcytic anemia, most recent CBC wnl. - repeat CBC   HCM - influenza vaccine received elsewhere - DEXA scan declined  Pre-operative evaluation Overall healthy elderly female with excellent functional status and should not have any specific concerns regarding low risk dental procedure.   Will await lab results and will forward records to oral surgeon once labs are completed.  No further work-up recommended at this point.  Zola Button, MD [place]

## 2020-08-13 LAB — CBC
Hematocrit: 33.8 % — ABNORMAL LOW (ref 34.0–46.6)
Hemoglobin: 10.8 g/dL — ABNORMAL LOW (ref 11.1–15.9)
MCH: 28.4 pg (ref 26.6–33.0)
MCHC: 32 g/dL (ref 31.5–35.7)
MCV: 89 fL (ref 79–97)
Platelets: 251 10*3/uL (ref 150–450)
RBC: 3.8 x10E6/uL (ref 3.77–5.28)
RDW: 12.8 % (ref 11.7–15.4)
WBC: 7.3 10*3/uL (ref 3.4–10.8)

## 2020-08-13 LAB — LIPID PANEL
Chol/HDL Ratio: 2.3 ratio (ref 0.0–4.4)
Cholesterol, Total: 210 mg/dL — ABNORMAL HIGH (ref 100–199)
HDL: 92 mg/dL (ref >39)
LDL Chol Calc (NIH): 112 mg/dL — ABNORMAL HIGH (ref 0–99)
Triglycerides: 34 mg/dL (ref 0–149)
VLDL Cholesterol Cal: 6 mg/dL (ref 5–40)

## 2020-08-13 LAB — BASIC METABOLIC PANEL
BUN/Creatinine Ratio: 27 (ref 12–28)
BUN: 29 mg/dL — ABNORMAL HIGH (ref 8–27)
CO2: 18 mmol/L — ABNORMAL LOW (ref 20–29)
Calcium: 9.4 mg/dL (ref 8.7–10.3)
Chloride: 107 mmol/L — ABNORMAL HIGH (ref 96–106)
Creatinine, Ser: 1.08 mg/dL — ABNORMAL HIGH (ref 0.57–1.00)
GFR calc Af Amer: 58 mL/min/{1.73_m2} — ABNORMAL LOW (ref >59)
GFR calc non Af Amer: 50 mL/min/{1.73_m2} — ABNORMAL LOW (ref >59)
Glucose: 92 mg/dL (ref 65–99)
Potassium: 3.8 mmol/L (ref 3.5–5.2)
Sodium: 141 mmol/L (ref 134–144)

## 2020-08-14 ENCOUNTER — Other Ambulatory Visit: Payer: Self-pay | Admitting: Family Medicine

## 2020-08-14 DIAGNOSIS — E785 Hyperlipidemia, unspecified: Secondary | ICD-10-CM

## 2020-08-14 DIAGNOSIS — D509 Iron deficiency anemia, unspecified: Secondary | ICD-10-CM

## 2020-08-14 MED ORDER — ATORVASTATIN CALCIUM 20 MG PO TABS: 20.0000 mg | ORAL_TABLET | Freq: Every day | ORAL | 3 refills | Status: DC

## 2020-08-14 NOTE — Progress Notes (Signed)
Call patient to discuss lab results.  LDL slightly elevated, so I will reorder atorvastatin 20 mg.  Mildly anemic, scheduled patient for follow-up lab visit 2/15 for anemia panel.

## 2020-08-19 ENCOUNTER — Other Ambulatory Visit: Payer: Self-pay

## 2020-08-19 ENCOUNTER — Other Ambulatory Visit: Payer: Medicare Other

## 2020-08-19 DIAGNOSIS — D509 Iron deficiency anemia, unspecified: Secondary | ICD-10-CM

## 2020-08-20 LAB — ANEMIA PROFILE B
Basophils Absolute: 0.1 10*3/uL (ref 0.0–0.2)
Basos: 1 %
EOS (ABSOLUTE): 0.3 10*3/uL (ref 0.0–0.4)
Eos: 4 %
Ferritin: 24 ng/mL (ref 15–150)
Folate: 6.3 ng/mL (ref >3.0)
Hematocrit: 33.3 % — ABNORMAL LOW (ref 34.0–46.6)
Hemoglobin: 10.9 g/dL — ABNORMAL LOW (ref 11.1–15.9)
Immature Grans (Abs): 0 10*3/uL (ref 0.0–0.1)
Immature Granulocytes: 0 %
Iron Saturation: 12 % — ABNORMAL LOW (ref 15–55)
Iron: 41 ug/dL (ref 27–139)
Lymphocytes Absolute: 1.7 10*3/uL (ref 0.7–3.1)
Lymphs: 21 %
MCH: 30.1 pg (ref 26.6–33.0)
MCHC: 32.7 g/dL (ref 31.5–35.7)
MCV: 92 fL (ref 79–97)
Monocytes Absolute: 0.5 10*3/uL (ref 0.1–0.9)
Monocytes: 7 %
Neutrophils Absolute: 5.5 10*3/uL (ref 1.4–7.0)
Neutrophils: 67 %
Platelets: 251 10*3/uL (ref 150–450)
RBC: 3.62 x10E6/uL — ABNORMAL LOW (ref 3.77–5.28)
RDW: 12.6 % (ref 11.7–15.4)
Retic Ct Pct: 2.1 % (ref 0.6–2.6)
Total Iron Binding Capacity: 335 ug/dL (ref 250–450)
UIBC: 294 ug/dL (ref 118–369)
Vitamin B-12: 217 pg/mL — ABNORMAL LOW (ref 232–1245)
WBC: 8.1 10*3/uL (ref 3.4–10.8)

## 2020-09-22 ENCOUNTER — Other Ambulatory Visit: Payer: Self-pay | Admitting: Family Medicine

## 2020-09-22 DIAGNOSIS — Z1231 Encounter for screening mammogram for malignant neoplasm of breast: Secondary | ICD-10-CM

## 2020-10-03 ENCOUNTER — Other Ambulatory Visit: Payer: Self-pay | Admitting: Family Medicine

## 2020-10-03 DIAGNOSIS — I10 Essential (primary) hypertension: Secondary | ICD-10-CM

## 2020-10-03 DIAGNOSIS — K219 Gastro-esophageal reflux disease without esophagitis: Secondary | ICD-10-CM

## 2020-10-03 MED ORDER — OMEPRAZOLE 40 MG PO CPDR: 40.0000 mg | DELAYED_RELEASE_CAPSULE | Freq: Every day | ORAL | 3 refills | Status: DC | PRN

## 2020-11-13 ENCOUNTER — Ambulatory Visit
Admission: RE | Admit: 2020-11-13 | Discharge: 2020-11-13 | Disposition: A | Discharge status: Home / Self Care | Payer: Medicare Other | Source: Ambulatory Visit | Attending: *Deleted | Admitting: *Deleted

## 2020-11-13 ENCOUNTER — Other Ambulatory Visit: Payer: Self-pay

## 2020-11-13 DIAGNOSIS — Z1231 Encounter for screening mammogram for malignant neoplasm of breast: Secondary | ICD-10-CM

## 2021-06-30 ENCOUNTER — Other Ambulatory Visit: Payer: Self-pay | Admitting: Family Medicine

## 2021-06-30 DIAGNOSIS — E785 Hyperlipidemia, unspecified: Secondary | ICD-10-CM

## 2021-09-12 ENCOUNTER — Other Ambulatory Visit: Payer: Self-pay | Admitting: Family Medicine

## 2021-09-12 DIAGNOSIS — K219 Gastro-esophageal reflux disease without esophagitis: Secondary | ICD-10-CM

## 2021-10-05 ENCOUNTER — Other Ambulatory Visit: Payer: Self-pay | Admitting: Family Medicine

## 2021-10-05 DIAGNOSIS — Z1231 Encounter for screening mammogram for malignant neoplasm of breast: Secondary | ICD-10-CM

## 2021-11-16 ENCOUNTER — Ambulatory Visit
Admission: RE | Admit: 2021-11-16 | Discharge: 2021-11-16 | Disposition: A | Discharge status: Home / Self Care | Payer: Medicare Other | Source: Ambulatory Visit | Attending: Family Medicine | Admitting: Family Medicine

## 2021-11-16 DIAGNOSIS — Z1231 Encounter for screening mammogram for malignant neoplasm of breast: Secondary | ICD-10-CM

## 2021-12-23 ENCOUNTER — Other Ambulatory Visit: Payer: Self-pay | Admitting: Family Medicine

## 2021-12-23 DIAGNOSIS — I10 Essential (primary) hypertension: Secondary | ICD-10-CM

## 2022-03-20 ENCOUNTER — Other Ambulatory Visit: Payer: Self-pay | Admitting: Family Medicine

## 2022-03-20 DIAGNOSIS — I10 Essential (primary) hypertension: Secondary | ICD-10-CM

## 2022-03-22 ENCOUNTER — Other Ambulatory Visit: Payer: Self-pay | Admitting: Family Medicine

## 2022-03-22 DIAGNOSIS — I10 Essential (primary) hypertension: Secondary | ICD-10-CM

## 2022-04-19 ENCOUNTER — Other Ambulatory Visit: Payer: Self-pay | Admitting: Family Medicine

## 2022-04-19 DIAGNOSIS — I10 Essential (primary) hypertension: Secondary | ICD-10-CM

## 2022-04-27 ENCOUNTER — Encounter: Payer: Self-pay | Admitting: Internal Medicine

## 2022-06-17 ENCOUNTER — Other Ambulatory Visit: Payer: Self-pay | Admitting: Family Medicine

## 2022-06-17 DIAGNOSIS — E785 Hyperlipidemia, unspecified: Secondary | ICD-10-CM

## 2022-07-18 ENCOUNTER — Other Ambulatory Visit: Payer: Self-pay | Admitting: Family Medicine

## 2022-07-18 DIAGNOSIS — I10 Essential (primary) hypertension: Secondary | ICD-10-CM

## 2022-07-19 ENCOUNTER — Other Ambulatory Visit: Payer: Self-pay | Admitting: Family Medicine

## 2022-07-19 DIAGNOSIS — I10 Essential (primary) hypertension: Secondary | ICD-10-CM

## 2022-08-17 ENCOUNTER — Other Ambulatory Visit: Payer: Self-pay | Admitting: Family Medicine

## 2022-08-17 DIAGNOSIS — I10 Essential (primary) hypertension: Secondary | ICD-10-CM

## 2022-08-19 ENCOUNTER — Telehealth: Payer: Self-pay

## 2022-08-19 ENCOUNTER — Other Ambulatory Visit: Payer: Self-pay | Admitting: Family Medicine

## 2022-08-19 DIAGNOSIS — I10 Essential (primary) hypertension: Secondary | ICD-10-CM

## 2022-08-19 MED ORDER — LISINOPRIL-HYDROCHLOROTHIAZIDE 20-25 MG PO TABS: 1.0000 | ORAL_TABLET | Freq: Every day | ORAL | 0 refills | Status: DC

## 2022-08-19 NOTE — Telephone Encounter (Signed)
Patient calls nurse line in regards to blood pressure medication.   Medication was denied due not being seen since 2022.  Patient has an upcoming apt scheduled with PCP on 2/26.  Patient is requesting a partial refill to get her to this apt.   Will forward to PCP.

## 2022-08-30 ENCOUNTER — Ambulatory Visit (INDEPENDENT_AMBULATORY_CARE_PROVIDER_SITE_OTHER): Payer: 59 | Admitting: Family Medicine

## 2022-08-30 ENCOUNTER — Encounter: Payer: Self-pay | Admitting: Family Medicine

## 2022-08-30 VITALS — BP 119/71 | HR 66 | Ht 62.0 in | Wt 118.2 lb

## 2022-08-30 DIAGNOSIS — I10 Essential (primary) hypertension: Secondary | ICD-10-CM

## 2022-08-30 DIAGNOSIS — Z23 Encounter for immunization: Secondary | ICD-10-CM | POA: Diagnosis not present

## 2022-08-30 DIAGNOSIS — D509 Iron deficiency anemia, unspecified: Secondary | ICD-10-CM

## 2022-08-30 DIAGNOSIS — J309 Allergic rhinitis, unspecified: Secondary | ICD-10-CM | POA: Insufficient documentation

## 2022-08-30 DIAGNOSIS — Z0000 Encounter for general adult medical examination without abnormal findings: Secondary | ICD-10-CM | POA: Diagnosis not present

## 2022-08-30 DIAGNOSIS — E538 Deficiency of other specified B group vitamins: Secondary | ICD-10-CM

## 2022-08-30 DIAGNOSIS — E782 Mixed hyperlipidemia: Secondary | ICD-10-CM

## 2022-08-30 MED ORDER — LISINOPRIL-HYDROCHLOROTHIAZIDE 20-25 MG PO TABS: 1.0000 | ORAL_TABLET | Freq: Every day | ORAL | 1 refills | Status: DC

## 2022-08-30 NOTE — Assessment & Plan Note (Signed)
Cough likely secondary to postnasal drip/allergic rhinitis.  Advised to continue Flonase as needed take more often.

## 2022-08-30 NOTE — Patient Instructions (Addendum)
It was nice seeing you today!  Continue using the Flonase as needed for allergies and cough.  They will call you for the Medicare Annual Wellness visit.  Come back in August for blood pressure follow-up.  Stay well, Nicole Button, MD [facility] [phone]  --  Make sure to check out at the front desk before you leave today.  Please arrive at least 15 minutes prior to your scheduled appointments.  If you had blood work today, I will send you a MyChart message or a letter if results are normal. Otherwise, I will give you a call.  If you had a referral placed, they will call you to set up an appointment. Please give [place] a call if you don't hear back in the next 2 weeks.  If you need additional refills before your next appointment, please call your pharmacy first.

## 2022-08-30 NOTE — Progress Notes (Signed)
SUBJECTIVE:   CHIEF COMPLAINT / HPI:  No chief complaint on file.   Cough and congestion, rhinorrhea intermittently for 2-3 weeks. Feels postnasal drip. No fever or chills.  Using Flonase occasionally which does help with her symptoms.  Occasional back aches. Doing her normal activities without problem.  Reports good adherence to lisinopril-HCTZ.  Denies smoking history, alcohol use. Reports regular exercise every week. Currently taking iron supplement.  Also was taking vitamin B12 supplement but ran out.   PERTINENT  PMH / PSH: HTN, HLD, anemia, GERD  Patient Care Team: Zola Button, MD as PCP - General (Family Medicine)   OBJECTIVE:   BP 119/71   Pulse 66   Ht '5\' 2"'$  (1.575 m)   Wt 118 lb 4 oz (53.6 kg)   SpO2 100%   BMI 21.63 kg/m   Physical Exam Constitutional:      General: She is not in acute distress. HENT:     Head: Normocephalic and atraumatic.  Cardiovascular:     Rate and Rhythm: Normal rate and regular rhythm.  Pulmonary:     Effort: Pulmonary effort is normal. No respiratory distress.     Breath sounds: Normal breath sounds.  Abdominal:     General: Bowel sounds are normal.     Palpations: Abdomen is soft.     Tenderness: There is no abdominal tenderness.  Musculoskeletal:     Cervical back: Neck supple.     Right lower leg: No edema.     Left lower leg: No edema.  Neurological:     Mental Status: She is alert.         08/30/2022    1:39 PM  Depression screen PHQ 2/9  Decreased Interest 0  Down, Depressed, Hopeless 0  PHQ - 2 Score 0  Altered sleeping 0  Tired, decreased energy 0  Change in appetite 0  Feeling bad or failure about yourself  0  Trouble concentrating 0  Moving slowly or fidgety/restless 0  Suicidal thoughts 0  PHQ-9 Score 0     Wt Readings from Last 3 Encounters:  08/30/22 118 lb 4 oz (53.6 kg)  08/12/20 121 lb 12.8 oz (55.2 kg)  01/13/18 120 lb (54.4 kg)        ASSESSMENT/PLAN:      Problem List Items  Addressed This Visit       Cardiovascular and Mediastinum   Essential hypertension, benign    Well-controlled, continue current regimen.      Relevant Medications   lisinopril-hydrochlorothiazide (ZESTORETIC) 20-25 MG tablet   Other Relevant Orders   Basic Metabolic Panel     Respiratory   Allergic rhinitis    Cough likely secondary to postnasal drip/allergic rhinitis.  Advised to continue Flonase as needed take more often.        Other   Microcytic anemia    On iron supplement      Relevant Medications   ferrous sulfate 325 (65 FE) MG tablet   Other Relevant Orders   CBC   Ferritin   Hyperlipidemia    On statin, check lipids today      Relevant Medications   lisinopril-hydrochlorothiazide (ZESTORETIC) 20-25 MG tablet   Other Relevant Orders   Lipid Panel   Other Visit Diagnoses     Encounter for routine history and physical examination of adult    -  Primary   Encounter for immunization       Relevant Orders   [employer] Fall 2023 Covid-19 Vaccine 19yr and  older (Completed)   Vitamin B12 deficiency       Relevant Orders   Vitamin B12   Essential hypertension       Relevant Medications   lisinopril-hydrochlorothiazide (ZESTORETIC) 20-25 MG tablet        HCM - declines shingles vaccine, never had chickenpox - declines DXA  Return in about 6 months (around 02/28/2023) for f/u HTN.   Zola Button, MD [place]

## 2022-08-30 NOTE — Assessment & Plan Note (Signed)
On statin, check lipids today

## 2022-08-30 NOTE — Assessment & Plan Note (Signed)
On iron supplement

## 2022-08-30 NOTE — Assessment & Plan Note (Signed)
Well-controlled, continue current regimen. 

## 2022-08-31 LAB — BASIC METABOLIC PANEL
BUN/Creatinine Ratio: 19 (ref 12–28)
BUN: 19 mg/dL (ref 8–27)
CO2: 24 mmol/L (ref 20–29)
Calcium: 9.4 mg/dL (ref 8.7–10.3)
Chloride: 99 mmol/L (ref 96–106)
Creatinine, Ser: 1 mg/dL (ref 0.57–1.00)
Glucose: 94 mg/dL (ref 70–99)
Potassium: 2.9 mmol/L — ABNORMAL LOW (ref 3.5–5.2)
Sodium: 138 mmol/L (ref 134–144)
eGFR: 58 mL/min/{1.73_m2} — ABNORMAL LOW (ref >59)

## 2022-08-31 LAB — CBC
Hematocrit: 34.7 % (ref 34.0–46.6)
Hemoglobin: 11.1 g/dL (ref 11.1–15.9)
MCH: 24.7 pg — ABNORMAL LOW (ref 26.6–33.0)
MCHC: 32 g/dL (ref 31.5–35.7)
MCV: 77 fL — ABNORMAL LOW (ref 79–97)
Platelets: 272 10*3/uL (ref 150–450)
RBC: 4.49 x10E6/uL (ref 3.77–5.28)
RDW: 18.4 % — ABNORMAL HIGH (ref 11.7–15.4)
WBC: 6.5 10*3/uL (ref 3.4–10.8)

## 2022-08-31 LAB — LIPID PANEL
Chol/HDL Ratio: 1.7 ratio (ref 0.0–4.4)
Cholesterol, Total: 159 mg/dL (ref 100–199)
HDL: 94 mg/dL (ref >39)
LDL Chol Calc (NIH): 56 mg/dL (ref 0–99)
Triglycerides: 37 mg/dL (ref 0–149)
VLDL Cholesterol Cal: 9 mg/dL (ref 5–40)

## 2022-08-31 LAB — VITAMIN B12: Vitamin B-12: >2000 pg/mL — ABNORMAL HIGH (ref 232–1245)

## 2022-08-31 LAB — FERRITIN: Ferritin: 57 ng/mL (ref 15–150)

## 2022-09-01 ENCOUNTER — Telehealth: Payer: Self-pay | Admitting: Family Medicine

## 2022-09-01 DIAGNOSIS — E876 Hypokalemia: Secondary | ICD-10-CM

## 2022-09-01 MED ORDER — POTASSIUM CHLORIDE CRYS ER 20 MEQ PO TBCR: 20.0000 meq | EXTENDED_RELEASE_TABLET | Freq: Two times a day (BID) | ORAL | 0 refills | Status: DC

## 2022-09-01 NOTE — Telephone Encounter (Signed)
Attempted to call patient regarding lab results but no answer.  If patient returns call, please let her know the following:  Potassium levels are low, I would like her to take a potassium pill twice a day for 2 days and come back for repeat blood work in 1 to 2 weeks.  Potassium and lab orders placed.  Vitamin B12 levels are high, she does not need to take any vitamin B12 supplements.  Iron levels are in normal range, she can continue taking her iron supplement.  Blood counts are normal and no anemia.  Otherwise blood work looks good.

## 2022-09-04 NOTE — Patient Instructions (Signed)

## 2022-09-04 NOTE — Progress Notes (Unsigned)
I connected with  Nicole Robbins on 09/07/2022 by a audio enabled telemedicine application and verified that I am speaking with the correct person using two identifiers.  Patient Location: Home  Provider Location: Home Office  I discussed the limitations of evaluation and management by telemedicine. The patient expressed understanding and agreed to proceed.  Subjective:   Nicole Robbins is a 77 y.o. female who presents for Medicare Annual (Subsequent) preventive examination.  Review of Systems    Per HPI unless specifically indicated below. Cardiac Risk Factors include: advanced age (>54mn, >>36women);female gender, Hypertension, CAD, and Hyperlipidemia.           Objective:       08/30/2022    1:32 PM 08/12/2020    9:38 AM 01/13/2018    3:22 PM  Vitals with BMI  Height '5\' 2"'$  '5\' 2"'$    Weight 118 lbs 4 oz 121 lbs 13 oz 120 lbs  BMI [id number]  Systolic [id number] Diastolic 71 66 60  Pulse 66 67 64    Today's Vitals   09/07/22 1123  PainSc: 0-No pain   There is no height or weight on file to calculate BMI.     08/30/2022    1:27 PM 08/12/2020    9:50 AM 12/12/2019    1:55 PM 01/13/2018    3:23 PM 10/19/2016    2:31 PM 05/17/2014   11:38 AM 08/04/2011   12:27 PM  Advanced Directives  Does Patient Have a Medical Advance Directive? No No No No No No Patient does not have advance directive  Would patient like information on creating a medical advance directive? No - Patient declined No - Patient declined Yes (MAU/Ambulatory/Procedural Areas - Information given) No - Patient declined No - Patient declined No - patient declined information     Current Medications (verified) Outpatient Encounter Medications as of 09/07/2022  Medication Sig   atorvastatin (LIPITOR) 20 MG tablet TAKE 1 TABLET(20 MG) BY MOUTH DAILY   cholecalciferol (VITAMIN D) 1000 units tablet Take 2,000 Units by mouth daily.   ferrous sulfate 325 (65 FE) MG tablet Take 325 mg by mouth daily with  breakfast.   fluticasone (FLONASE) 50 MCG/ACT nasal spray Place into both nostrils daily as needed for allergies or rhinitis.   lisinopril-hydrochlorothiazide (ZESTORETIC) 20-25 MG tablet Take 1 tablet by mouth daily.   omeprazole (PRILOSEC) 40 MG capsule TAKE 1 CAPSULE(40 MG) BY MOUTH DAILY AS NEEDED   polyethylene glycol (MIRALAX / GLYCOLAX) packet Take 17 g by mouth daily as needed.   [DISCONTINUED] potassium chloride SA (KLOR-CON M) 20 MEQ tablet Take 1 tablet (20 mEq total) by mouth 2 (two) times daily for 2 days.   No facility-administered encounter medications on file as of 09/07/2022.    Allergies (verified) Tetracyclines & related   History: Past Medical History:  Diagnosis Date   Anemia    Breast calcification, right    Esophagitis    GERD (gastroesophageal reflux disease)    Hepatic abscess    Hepatitis    liver absess07-drained   Hiatal hernia    Hypertension    Hypopotassemia    Iron deficiency anemia    Vitamin D deficiency    Past Surgical History:  Procedure Laterality Date   BREAST BIOPSY  08/09/2011   Procedure: BREAST BIOPSY WITH NEEDLE LOCALIZATION;  Surgeon: CHaywood Lasso MD;  [place]  Service: General;  Laterality: Left;  needle localized at breast center of [place]  left breast biopsy    BREAST EXCISIONAL BIOPSY Left    benign   LIVER BIOPSY  2007   liver absess drained   TUBAL LIGATION     Family History  Problem Relation Age of Onset   Heart disease Mother    Diabetes Mother    Hypertension Mother    Lung cancer Father    Heart disease Brother    Colon cancer Neg Hx    Breast cancer Neg Hx    Social History   Socioeconomic History   Marital status: Divorced    Spouse name: Not on file   Number of children: 0   Years of education: 14   Highest education level: Associate degree: academic program  Occupational History   Occupation: Retired  Tobacco Use   Smoking status: Former    Types: Cigarettes     Quit date: 08/02/1982    Years since quitting: 40.1   Smokeless tobacco: Never  Vaping Use   Vaping Use: Never used  Substance and Sexual Activity   Alcohol use: No   Drug use: No   Sexual activity: Not Currently  Other Topics Concern   Not on file  Social History Narrative   Patient lives alone in [place].    Patient is divorced and has no children.    Patient enjoys working out and being active.    Patients wellness is very important to her.    Social Determinants of Health   Financial Resource Strain: Low Risk  (09/07/2022)   Overall Financial Resource Strain (CARDIA)    Difficulty of Paying Living Expenses: Not very hard  Food Insecurity: No Food Insecurity (09/07/2022)   Hunger Vital Sign    Worried About Running Out of Food in the Last Year: Never true    Ran Out of Food in the Last Year: Never true  Transportation Needs: No Transportation Needs (09/07/2022)   PRAPARE - Hydrologist (Medical): No    Lack of Transportation (Non-Medical): No  Physical Activity: Insufficiently Active (09/07/2022)   Exercise Vital Sign    Days of Exercise per Week: 4 days    Minutes of Exercise per Session: 30 min  Stress: No Stress Concern Present (09/07/2022)   [place]    Feeling of Stress : Not at all  Social Connections: Moderately Integrated (09/07/2022)   Social Connection and Isolation Panel [NHANES]    Frequency of Communication with Friends and Family: More than three times a week    Frequency of Social Gatherings with Friends and Family: More than three times a week    Attends Religious Services: More than 4 times per year    Active Member of [employer] or Organizations: Yes    Attends Music therapist: More than 4 times per year    Marital Status: Divorced    Tobacco Counseling Counseling given: No   Clinical Intake:  Pre-visit preparation completed: No  Pain : No/denies  pain Pain Score: 0-No pain     Nutritional Status: BMI of 19-24  Normal Nutritional Risks: None Diabetes: No  How often do you need to have someone help you when you read instructions, pamphlets, or other written materials from your doctor or pharmacy?: 1 - Never  Diabetic? No  Interpreter Needed?: No  Information entered by :: Donnie Mesa, CMA   Activities of Daily Living    09/07/2022   11:20 AM  In your present state of  health, do you have any difficulty performing the following activities:  Hearing? 0  Vision? 1  Comment [facility]  Difficulty concentrating or making decisions? 0  Walking or climbing stairs? 0  Dressing or bathing? 0  Doing errands, shopping? 0    Patient Care Team: Zola Button, MD as PCP - General (Family Medicine)  Indicate any recent Medical Services you may have received from other than [facility] providers in the past year (date may be approximate).     Assessment:   This is a routine wellness examination for Nicole Robbins.   Hearing/Vision screen Denies any hearing issues. Denies any change to her vision. Annual Eye Exam. [facility].  Dietary issues and exercise activities discussed: Current Exercise Habits: Structured exercise class;Home exercise routine, Type of exercise: strength training/weights, Time (Minutes): 30, Frequency (Times/Week): 3, Weekly Exercise (Minutes/Week): 90, Intensity: Moderate, Exercise limited by: None identified   Goals Addressed   None    Depression Screen    09/07/2022   11:19 AM 08/30/2022    1:39 PM 08/12/2020    9:44 AM 12/12/2019    1:56 PM 01/13/2018    3:23 PM 10/19/2016    2:32 PM 05/17/2014   11:38 AM  PHQ 2/9 Scores  PHQ - 2 Score 0 0 0 0 0 0 0  PHQ- 9 Score 0 0 0        Fall Risk    09/07/2022   11:20 AM 08/30/2022    1:40 PM 08/30/2022    1:28 PM 08/12/2020    9:45 AM 12/12/2019    1:56 PM  Strang in the past year? 0 0 0 0 0  Number falls in past yr: 0 0 0 0   Injury with Fall? 0 0 0 0    Risk for fall due to : No Fall Risks      Follow up Falls evaluation completed        FALL RISK PREVENTION PERTAINING TO THE HOME:  Any stairs in or around the home? No  If so, are there any without handrails? No  Home free of loose throw rugs in walkways, pet beds, electrical cords, etc? No  Adequate lighting in your home to reduce risk of falls? No   ASSISTIVE DEVICES UTILIZED TO PREVENT FALLS:  Life alert? No  Use of a cane, walker or w/c? No  Grab bars in the bathroom? No  Shower chair or bench in shower? No  Elevated toilet seat or a handicapped toilet? No   TIMED UP AND GO:  Was the test performed? Unable to perform, virtual appointment   Cognitive Function:        09/07/2022   11:21 AM 12/12/2019    2:01 PM  6CIT Screen  What Year? 0 points 0 points  What month? 0 points 0 points  What time? 0 points 0 points  Count back from 20 0 points 2 points  Months in reverse 0 points 0 points  Repeat phrase 0 points 0 points  Total Score 0 points 2 points    Immunizations Immunization History  Administered Date(s) Administered   COVID-19, mRNA, vaccine(Comirnaty)12 years and older 08/30/2022   Fluad Quad(high Dose 65+) 03/27/2019   Influenza, High Dose Seasonal PF 02/17/2015, 04/16/2018, 03/23/2022   Influenza-Unspecified 03/15/2016, 04/26/2017, 04/16/2018   Moderna Sars-Covid-2 Vaccination 08/17/2019, 09/14/2019, 05/23/2020   PFIZER Comirnaty(Gray Top)Covid-19 Tri-Sucrose Vaccine 03/23/2022   Pneumococcal Conjugate-13 11/20/2014   Pneumococcal Polysaccharide-23 10/19/2016   Rsv, Bivalent, Protein Subunit Rsvpref,pf Evans Lance)  02/20/2022   Tdap 12/14/2014    TDAP status: Up to date  Flu Vaccine status: Up to date  Pneumococcal vaccine status: Up to date  Covid-19 vaccine status: Completed vaccines  Qualifies for Shingles Vaccine? Yes   Zostavax completed No   Shingrix Completed?: No.    Education has been provided regarding the importance of this vaccine.  Patient has been advised to call insurance company to determine out of pocket expense if they have not yet received this vaccine. Advised may also receive vaccine at local pharmacy or Health Dept. Verbalized acceptance and understanding. The pt state she never had chicken pox.  Screening Tests Health Maintenance  Topic Date Due   Zoster Vaccines- Shingrix (1 of 2) Never done   DEXA SCAN  Never done   COVID-19 Vaccine (6 - 2023-24 season) 10/25/2022   Medicare Annual Wellness (AWV)  09/07/2023   DTaP/Tdap/Td (2 - Td or Tdap) 12/13/2024   Pneumonia Vaccine 66+ Years old  Completed   INFLUENZA VACCINE  Completed   Hepatitis C Screening  Completed   HPV VACCINES  Aged Out   COLONOSCOPY (Pts 45-65yr Insurance coverage will need to be confirmed)  Discontinued    Health Maintenance  Health Maintenance Due  Topic Date Due   Zoster Vaccines- Shingrix (1 of 2) Never done   DEXA SCAN  Never done    Colorectal cancer screening: No longer required.   Mammogram status: Completed 11/16/2021. Repeat every year  DEXA Scan: declined  Lung Cancer Screening: (Low Dose CT Chest recommended if Age 78-80years, 30 pack-year currently smoking OR have quit w/in 15years.) does not qualify.   Lung Cancer Screening Referral: not applicable   Additional Screening:  Hepatitis C Screening: does qualify; Completed 05/17/2014  Vision Screening: Recommended annual ophthalmology exams for early detection of glaucoma and other disorders of the eye. Is the patient up to date with their annual eye exam?  Yes  Who is the provider or what is the name of the office in which the patient attends annual eye exams? [facility]  If pt is not established with a provider, would they like to be referred to a provider to establish care? No .   Dental Screening: Recommended annual dental exams for proper oral hygiene  Community Resource Referral / Chronic Care Management: CRR required this visit?  No   CCM required  this visit?  No      Plan:     I have personally reviewed and noted the following in the patient's chart:   Medical and social history Use of alcohol, tobacco or illicit drugs  Current medications and supplements including opioid prescriptions. Patient is not currently taking opioid prescriptions. Functional ability and status Nutritional status Physical activity Advanced directives List of other physicians Hospitalizations, surgeries, and ER visits in previous 12 months Vitals Screenings to include cognitive, depression, and falls Referrals and appointments  In addition, I have reviewed and discussed with patient certain preventive protocols, quality metrics, and best practice recommendations. A written personalized care plan for preventive services as well as general preventive health recommendations were provided to patient.   Nicole Robbins, Thank you for taking time to come for your Medicare Wellness Visit. I appreciate your ongoing commitment to your health goals. Please review the following plan we discussed and let me know if I can assist you in the future.   These are the goals we discussed:  Goals      Patient Stated  Maintain quality of life. Patient enjoys working out and blessed she can.         This is a list of the screening recommended for you and due dates:  Health Maintenance  Topic Date Due   Zoster (Shingles) Vaccine (1 of 2) Never done   DEXA scan (bone density measurement)  Never done   COVID-19 Vaccine (6 - 2023-24 season) 10/25/2022   Medicare Annual Wellness Visit  09/07/2023   DTaP/Tdap/Td vaccine (2 - Td or Tdap) 12/13/2024   Pneumonia Vaccine  Completed   Flu Shot  Completed   Hepatitis C Screening: USPSTF Recommendation to screen - Ages 18-79 yo.  Completed   HPV Vaccine  Aged Out   Colon Cancer Screening  Discontinued      Wilson Singer,  [facility]   09/07/2022   Nurse Notes: Approximately 30 minute Non-Face -To-Face Medicare Wellness  Visit

## 2022-09-06 ENCOUNTER — Telehealth (HOSPITAL_BASED_OUTPATIENT_CLINIC_OR_DEPARTMENT_OTHER): Payer: Self-pay | Admitting: Family Medicine

## 2022-09-06 NOTE — Telephone Encounter (Signed)
I left a message on patient's voice mail to confirm AWV on 09/07/2022 at 11:00.

## 2022-09-07 ENCOUNTER — Ambulatory Visit (INDEPENDENT_AMBULATORY_CARE_PROVIDER_SITE_OTHER): Payer: 59

## 2022-09-07 DIAGNOSIS — Z59819 Housing instability, housed unspecified: Secondary | ICD-10-CM

## 2022-09-07 DIAGNOSIS — Z0000 Encounter for general adult medical examination without abnormal findings: Secondary | ICD-10-CM | POA: Diagnosis not present

## 2022-09-07 NOTE — Addendum Note (Signed)
Addended by: Wilson Singer on: 09/07/2022 12:06 PM   Modules accepted: Orders

## 2022-09-07 NOTE — Telephone Encounter (Signed)
Unsuccessful attempt to reach patient.  Left voicemail to call back regarding labs.  See message below if patient calls back.

## 2022-09-08 ENCOUNTER — Telehealth: Payer: Self-pay | Admitting: *Deleted

## 2022-09-08 NOTE — Progress Notes (Signed)
  Care Coordination   Note   09/08/2022 Name: Nicole Robbins MRN: [id number] DOB: Jul 23, 1945  Nicole Robbins is a 77 y.o. year old female who sees Zola Button, MD for primary care. I reached out to Durene Romans by phone in reference to a referral.     Follow up plan:  Telephone appointment with care coordination team member scheduled for:  09/13/22  Encounter Outcome:  Pt. Scheduled  [place]  Direct Dial: [phone]

## 2022-09-09 MED ORDER — POTASSIUM CHLORIDE CRYS ER 20 MEQ PO TBCR: 20.0000 meq | EXTENDED_RELEASE_TABLET | Freq: Two times a day (BID) | ORAL | 0 refills | Status: DC

## 2022-09-09 NOTE — Telephone Encounter (Signed)
Patient returns call to nurse line to discuss results.   Results discussed with patient. Patient advised she needs to start potassium and return for blood work.   Patient is requesting a prescription for Potasium.   Will forward to PCP.

## 2022-09-09 NOTE — Addendum Note (Signed)
Addended by: Zola Button D on: 09/09/2022 01:29 PM   Modules accepted: Orders

## 2022-09-10 NOTE — Telephone Encounter (Signed)
Attempted to contact patient to discuss.  However, no answer.

## 2022-09-13 ENCOUNTER — Ambulatory Visit: Payer: Self-pay

## 2022-09-13 NOTE — Patient Outreach (Signed)
  Care Coordination   Community Resource Consult  Visit Note   09/13/2022 Name: Nicole Robbins MRN: [id number] DOB: May 26, 1946  Nicole Robbins is a 77 y.o. year old female who sees Zola Button, MD for primary care. I spoke with  Durene Romans by phone today.  What matters to the patients health and wellness today?  Patient was referred for assistance with rent. Patient reports her friend helped her pay the rent so she is no longer in need of assistance. However, patient reports her roof has leaked since 2022 and her landlord has not repaired. There was mold in her closet in the past which caused her to lose all of her clothing. This has been repaired by the landlord but the roof continues to leak when it rains. Patient is also interested in receiving information on local food pantries.    SDOH assessments and interventions completed:  Yes  SDOH Interventions Today    Flowsheet Row Most Recent Value  SDOH Interventions   Food Insecurity Interventions Other (Comment)  [Patient has FNS. Provided list of food pantries]  Housing Interventions [id number] Referral  [[place] [employer]]  Utilities Interventions Other (Comment)  [Patient uses [employer]-Card to cover utility costs]        Care Coordination Interventions:  Yes, provided   Interventions Today    Flowsheet Row Most Recent Value  Chronic Disease   Chronic disease during today's visit Hypertension (HTN)  General Interventions   General Interventions Discussed/Reviewed General Interventions Discussed, Artist referral to [place]  Education Interventions   Education Provided Provided Engineer, site, Provided Education  Provided Verbal Education On [employer]  [Provided information on food pantry sites]        Follow up plan: No further intervention required. The patient is not eligible for ongoing care coordination services.    Encounter Outcome:   Pt. Visit Completed   Daneen Schick, BSW, CDP Social Worker, Certified Dementia Practitioner [place] [facility]  Care Coordination [phone]

## 2022-09-13 NOTE — Patient Instructions (Signed)
Visit Information  Thank you for taking time to visit with me today. Please don't hesitate to contact me if I can be of assistance to you.   Following are the goals we discussed today:  - Work with [employer] to address housing concerns  If you are experiencing a [place] or [place] or need someone to talk to, please call 911  The patient verbalized understanding of instructions, educational materials, and care plan provided today and DECLINED offer to receive copy of patient instructions, educational materials, and care plan.   No further follow up required: Please contact your primary care provider as needed  Daneen Schick, Arita Miss, CDP Social Worker, Certified Dementia Practitioner [place] Coordination [phone]

## 2022-09-13 NOTE — Telephone Encounter (Signed)
Spoke with patient.   She reports she finished taking Potassium on Saturday.   Patient scheduled for lab work on 3/20.

## 2022-09-14 ENCOUNTER — Other Ambulatory Visit: Payer: Self-pay | Admitting: Family Medicine

## 2022-09-14 DIAGNOSIS — K219 Gastro-esophageal reflux disease without esophagitis: Secondary | ICD-10-CM

## 2022-09-16 ENCOUNTER — Other Ambulatory Visit: Payer: Self-pay | Admitting: Family Medicine

## 2022-09-16 DIAGNOSIS — I10 Essential (primary) hypertension: Secondary | ICD-10-CM

## 2022-09-22 ENCOUNTER — Other Ambulatory Visit: Payer: 59

## 2022-09-22 DIAGNOSIS — E876 Hypokalemia: Secondary | ICD-10-CM

## 2022-09-23 ENCOUNTER — Other Ambulatory Visit: Payer: Self-pay | Admitting: Family Medicine

## 2022-09-23 ENCOUNTER — Telehealth: Payer: Self-pay | Admitting: Family Medicine

## 2022-09-23 DIAGNOSIS — E876 Hypokalemia: Secondary | ICD-10-CM

## 2022-09-23 LAB — BASIC METABOLIC PANEL
BUN/Creatinine Ratio: 22 (ref 12–28)
BUN: 22 mg/dL (ref 8–27)
CO2: 20 mmol/L (ref 20–29)
Calcium: 9.4 mg/dL (ref 8.7–10.3)
Chloride: 103 mmol/L (ref 96–106)
Creatinine, Ser: 0.98 mg/dL (ref 0.57–1.00)
Glucose: 97 mg/dL (ref 70–99)
Potassium: 3 mmol/L — ABNORMAL LOW (ref 3.5–5.2)
Sodium: 143 mmol/L (ref 134–144)
eGFR: 59 mL/min/{1.73_m2} — ABNORMAL LOW (ref >59)

## 2022-09-23 MED ORDER — POTASSIUM CHLORIDE CRYS ER 20 MEQ PO TBCR: 20.0000 meq | EXTENDED_RELEASE_TABLET | Freq: Every day | ORAL | 1 refills | Status: DC

## 2022-09-23 NOTE — Telephone Encounter (Signed)
Call patient with lab results.  Potassium levels are still slightly low despite potassium supplementation.  I think this is due to her HCTZ.  I advised her to take a potassium supplement 20 mEq daily.  Will plan to recheck her labs in about 1 month, she will return then for lab visit.

## 2022-10-07 ENCOUNTER — Encounter: Payer: Self-pay | Admitting: Family Medicine

## 2022-10-07 DIAGNOSIS — Z1231 Encounter for screening mammogram for malignant neoplasm of breast: Secondary | ICD-10-CM

## 2022-10-08 ENCOUNTER — Telehealth: Payer: Self-pay | Admitting: *Deleted

## 2022-10-08 DIAGNOSIS — Z1231 Encounter for screening mammogram for malignant neoplasm of breast: Secondary | ICD-10-CM

## 2022-10-08 NOTE — Telephone Encounter (Signed)
Patient left message on referral line asking for her PCP to put in an order for her mammogram.  They were unable to get her scheduled since they couldn't find the provider in the system.  Will send to Md to put in order.   Thanks [employer]

## 2022-10-08 NOTE — Telephone Encounter (Signed)
Mammogram order placed

## 2022-10-11 NOTE — Telephone Encounter (Signed)
Patient is aware. Quasim Doyon,CMA  

## 2022-11-01 ENCOUNTER — Other Ambulatory Visit: Payer: 59

## 2022-11-01 DIAGNOSIS — E876 Hypokalemia: Secondary | ICD-10-CM

## 2022-11-02 LAB — BASIC METABOLIC PANEL
BUN/Creatinine Ratio: 21 (ref 12–28)
BUN: 20 mg/dL (ref 8–27)
CO2: 22 mmol/L (ref 20–29)
Calcium: 9.1 mg/dL (ref 8.7–10.3)
Chloride: 104 mmol/L (ref 96–106)
Creatinine, Ser: 0.97 mg/dL (ref 0.57–1.00)
Glucose: 108 mg/dL — ABNORMAL HIGH (ref 70–99)
Potassium: 3.9 mmol/L (ref 3.5–5.2)
Sodium: 141 mmol/L (ref 134–144)
eGFR: 60 mL/min/{1.73_m2} (ref >59)

## 2022-11-03 ENCOUNTER — Encounter: Payer: Self-pay | Admitting: Family Medicine

## 2022-11-18 ENCOUNTER — Ambulatory Visit
Admission: RE | Admit: 2022-11-18 | Discharge: 2022-11-18 | Disposition: A | Discharge status: Home / Self Care | Payer: 59 | Source: Ambulatory Visit | Attending: Family Medicine | Admitting: Family Medicine

## 2022-11-18 DIAGNOSIS — Z1231 Encounter for screening mammogram for malignant neoplasm of breast: Secondary | ICD-10-CM

## 2022-12-14 ENCOUNTER — Other Ambulatory Visit: Payer: Self-pay | Admitting: Family Medicine

## 2022-12-21 ENCOUNTER — Other Ambulatory Visit: Payer: Self-pay

## 2022-12-21 ENCOUNTER — Ambulatory Visit
Admission: RE | Admit: 2022-12-21 | Discharge: 2022-12-21 | Disposition: A | Discharge status: Home / Self Care | Payer: 59 | Source: Ambulatory Visit | Attending: Family Medicine | Admitting: Family Medicine

## 2022-12-21 ENCOUNTER — Ambulatory Visit (INDEPENDENT_AMBULATORY_CARE_PROVIDER_SITE_OTHER): Payer: 59 | Admitting: Family Medicine

## 2022-12-21 ENCOUNTER — Encounter: Payer: Self-pay | Admitting: Family Medicine

## 2022-12-21 VITALS — BP 128/75 | HR 76 | Ht 62.0 in | Wt 120.2 lb

## 2022-12-21 DIAGNOSIS — I10 Essential (primary) hypertension: Secondary | ICD-10-CM | POA: Diagnosis not present

## 2022-12-21 DIAGNOSIS — R053 Chronic cough: Secondary | ICD-10-CM

## 2022-12-21 MED ORDER — VALSARTAN-HYDROCHLOROTHIAZIDE 160-25 MG PO TABS: 1.0000 | ORAL_TABLET | Freq: Every day | ORAL | 1 refills | Status: DC

## 2022-12-21 NOTE — Assessment & Plan Note (Signed)
Well-controlled, switch to valsartan-HCTZ 160-25 mg daily (equivalent dose) in light of chronic cough as above.

## 2022-12-21 NOTE — Patient Instructions (Addendum)
It was nice seeing you today!  Start the new blood pressure medication (Diovan) and stop the old medication (Zestoreti).  Use the Flonase every day.  Continue the omeprazole every day.  Get your x-rays done here.  You do not need an appointment. [facility] Address: [address] [place], [place], [place] [address] Phone: [phone]   If cough does not go away, you can return for follow-up in the next several weeks.  Stay well, Littie Deeds, MD [facility] [phone]  --  Make sure to check out at the front desk before you leave today.  Please arrive at least 15 minutes prior to your scheduled appointments.  If you had blood work today, I will send you a MyChart message or a letter if results are normal. Otherwise, I will give you a call.  If you had a referral placed, they will call you to set up an appointment. Please give [place] a call if you don't hear back in the next 2 weeks.  If you need additional refills before your next appointment, please call your pharmacy first.

## 2022-12-21 NOTE — Progress Notes (Signed)
    SUBJECTIVE:   CHIEF COMPLAINT / HPI:  Chief Complaint  Patient presents with   Cough    Reports intermittent cough x 2 months, worse at night, sometimes occurs during the day. Occasionally productive. Associated rhinorrhea, congestion. Feels some congestion/rattling in her chest Tried some Mucinex, didn't help much Using Flonase intermittently Omeprazole daily, sometimes still has issues with reflux which improves with omeprazole Denies chest pain, SOB. Thinks dust is getting in the house.  PERTINENT  PMH / PSH: HTN, allergic rhinitis, GERD Former smoker for 1 year  Patient Care Team: Littie Deeds, MD as PCP - General (Family Medicine)   OBJECTIVE:   BP 128/75   Pulse 76   Ht 5\' 2"  (1.575 m)   Wt 120 lb 3.2 oz (54.5 kg)   SpO2 100%   BMI 21.98 kg/m   Physical Exam Constitutional:      General: She is not in acute distress. Cardiovascular:     Rate and Rhythm: Normal rate and regular rhythm.  Pulmonary:     Effort: Pulmonary effort is normal. No respiratory distress.     Comments: Faint fine diffuse inspiratory rales Musculoskeletal:     Cervical back: Neck supple.  Neurological:     Mental Status: She is alert.         12/21/2022    1:20 PM  Depression screen PHQ 2/9  Decreased Interest 0  Down, Depressed, Hopeless 0  PHQ - 2 Score 0  Altered sleeping 0  Tired, decreased energy 0  Change in appetite 0  Feeling bad or failure about yourself  0  Trouble concentrating 0  Moving slowly or fidgety/restless 0  Suicidal thoughts 0  PHQ-9 Score 0     {Show previous vital signs (optional):23777}    ASSESSMENT/PLAN:   Problem List Items Addressed This Visit       Cardiovascular and Mediastinum   Essential hypertension, benign    Well-controlled, switch to valsartan-HCTZ 160-25 mg daily (equivalent dose) in light of chronic cough as above.      Relevant Medications   valsartan-hydrochlorothiazide (DIOVAN-HCT) 160-25 MG tablet   Other Visit  Diagnoses     Chronic cough    -  Primary   Relevant Orders   DG Chest 2 View     Chronic cough for 2 months, possibly upper airway cough syndrome.  Advised to take Flonase daily instead of as needed.  Obtain chest imaging given chronicity of symptoms.  Continue PPI for GERD treatment.  Will switch ACE inhibitor to ARB in case this is ACE inhibitor induced cough.  Return in about 6 months (around 06/22/2023), or if symptoms worsen or fail to improve, for f/u HTN.   Littie Deeds, MD [facility]

## 2022-12-28 ENCOUNTER — Telehealth: Payer: Self-pay | Admitting: Family Medicine

## 2022-12-28 DIAGNOSIS — R053 Chronic cough: Secondary | ICD-10-CM | POA: Insufficient documentation

## 2022-12-28 NOTE — Telephone Encounter (Signed)
Called patient to discuss CXR results.  CXR finding of chronic interstitial changes.  She continues to have chronic cough, denying SOB.  This could possibly be some underlying ILD.  I explained that we do not quite know what the CXR results mean at this time.  I think this needs further investigation with CT chest and will also send to pulmonology referral.  Route through clinical team for scheduling.

## 2022-12-28 NOTE — Telephone Encounter (Signed)
Scheduled pt appt for 07/16 @11 :00 am

## 2023-01-18 ENCOUNTER — Ambulatory Visit (HOSPITAL_COMMUNITY)
Admission: RE | Admit: 2023-01-18 | Discharge: 2023-01-18 | Disposition: A | Discharge status: Home / Self Care | Payer: 59 | Source: Ambulatory Visit | Attending: Family Medicine | Admitting: Family Medicine

## 2023-01-18 DIAGNOSIS — R053 Chronic cough: Secondary | ICD-10-CM | POA: Insufficient documentation

## 2023-01-18 DIAGNOSIS — J479 Bronchiectasis, uncomplicated: Secondary | ICD-10-CM | POA: Insufficient documentation

## 2023-01-18 HISTORY — DX: Bronchiectasis, uncomplicated: J47.9

## 2023-01-18 MED ORDER — IOHEXOL 350 MG/ML SOLN
75.0000 mL | Freq: Once | INTRAVENOUS | Status: AC | PRN
  Administered 2023-01-18: 75 mL via INTRAVENOUS

## 2023-01-24 ENCOUNTER — Telehealth: Payer: Self-pay

## 2023-01-24 NOTE — Telephone Encounter (Signed)
-----   Message from [facility] sent at 01/24/2023  7:54 AM EDT ----- Please contact Nicole Robbins to see if her cough is continuing.  1) If her cough is continuing, see if she has heard from the referral Dr Wynelle Link made to pulmonology.  Does the patient wish to see the Lung specialist about her cough?  2) if her cough is continuing, does she wish [place] to reschedule her Chest CT that Dr Wynelle Link had ordered.   Thank you,  Tawanna Cooler

## 2023-01-24 NOTE — Telephone Encounter (Signed)
Reviewed

## 2023-01-24 NOTE — Telephone Encounter (Signed)
Attempted to reach patient. No answer. Unable to LVM. Mailbox is not activated.Aquilla Solian, CMA

## 2023-01-24 NOTE — Telephone Encounter (Signed)
Patient returns call to nurse line. She reports that she is continuing to have cough.   She states that she did go for the CT chest on 01/18/23. Results pending.   Provided patient with contact information for pulmonologist. She is requesting to wait to contact the specialist until after she gets results from CT scan.   Veronda Prude, RN

## 2023-01-26 ENCOUNTER — Telehealth: Payer: Self-pay | Admitting: Family Medicine

## 2023-01-26 DIAGNOSIS — J471 Bronchiectasis with (acute) exacerbation: Secondary | ICD-10-CM

## 2023-01-26 DIAGNOSIS — J849 Interstitial pulmonary disease, unspecified: Secondary | ICD-10-CM

## 2023-01-26 NOTE — Telephone Encounter (Signed)
I spoke with Nicole Robbins about her Lung CT fidnings.  Her coughing has resolved.   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

## 2023-01-26 NOTE — Telephone Encounter (Signed)
Reviewed and agree with Dr Koval's plan.   

## 2023-03-18 ENCOUNTER — Other Ambulatory Visit: Payer: Self-pay | Admitting: Family Medicine

## 2023-05-16 ENCOUNTER — Encounter: Payer: Self-pay | Admitting: Family Medicine

## 2023-05-16 DIAGNOSIS — R6889 Other general symptoms and signs: Secondary | ICD-10-CM | POA: Insufficient documentation

## 2023-06-09 ENCOUNTER — Ambulatory Visit (INDEPENDENT_AMBULATORY_CARE_PROVIDER_SITE_OTHER): Payer: 59 | Admitting: Student

## 2023-06-09 VITALS — BP 158/87 | HR 76 | Ht 62.0 in | Wt 110.2 lb

## 2023-06-09 DIAGNOSIS — J189 Pneumonia, unspecified organism: Secondary | ICD-10-CM

## 2023-06-09 DIAGNOSIS — K625 Hemorrhage of anus and rectum: Secondary | ICD-10-CM | POA: Diagnosis not present

## 2023-06-09 MED ORDER — AMOXICILLIN-POT CLAVULANATE ER 1000-62.5 MG PO TB12
2.0000 | ORAL_TABLET | Freq: Two times a day (BID) | ORAL | 0 refills | Status: AC
Start: 2023-06-09 — End: 2023-06-16

## 2023-06-09 MED ORDER — ALBUTEROL SULFATE HFA 108 (90 BASE) MCG/ACT IN AERS
2.0000 | INHALATION_SPRAY | RESPIRATORY_TRACT | 0 refills | Status: DC | PRN
Start: 2023-06-09 — End: 2023-06-27

## 2023-06-09 NOTE — Progress Notes (Signed)
    SUBJECTIVE:   CHIEF COMPLAINT / HPI: Cough  Cough Has been having worsening cough. The cough, initially intermittent, has become persistent since November. It is associated with wheezing and green sputum. The patient has been using over-the-counter cough suppressants and nasal spray for symptom relief. The cough is worse at night and has been disrupting her sleep. The patient also reports a decrease in appetite and some weight loss. She denies any associated fever, vomiting, or lightheadedness.  Had previous CT scan which showed bronchiectasis and was referred to pulmonology but never went.   Rectal Bleeding The patient also reports rectal bleeding for the past month. The bleeding is bright red and is noticed on her undergarments. States that it is a very small amount.  Sometimes small streak mixed into stool.  Denies any vaginal bleeding. The patient has a known hemorrhoid and suspects this could be the source of the bleeding.  Denies any lightheadedness, dizziness, fatigue.  Denies any Goody powders/NSAIDs.  PERTINENT  PMH / PSH: Chronic Cough  OBJECTIVE:   BP (!) 158/87   Pulse 76   Ht 5\' 2"  (1.575 m)   Wt 110 lb 3.2 oz (50 kg)   SpO2 100%   BMI 20.16 kg/m   General: NAD, awake, alert, responsive to questions Head: Normocephalic atraumatic CV: Regular rate and rhythm no murmurs rubs or gallops Respiratory: Upper lung field wheezing present, poor air movement throughout rest of lung fields, chest rises symmetrically,  no increased work of breathing on room air Abdomen: Soft, non-tender Extremities: Moves upper and lower extremities freely  ASSESSMENT/PLAN:   Assessment & Plan Community acquired pneumonia, unspecified laterality Will treat this as a pneumonia given patient has been having productive coughing for around 4 weeks.  Possible that this is bronchiectasis flare as well.  For now we will treat with antibiotics and start albuterol inhaler to see if this improves things.   Discussed need for pulmonology follow-up. - albuterol (VENTOLIN HFA) 108 (90 Base) MCG/ACT inhaler; Inhale 2 puffs into the lungs every 4 (four) hours as needed for wheezing or shortness of breath.  - amoxicillin-clavulanate (AUGMENTIN XR) 1000-62.5 MG 12 hr tablet; Take 2 tablets by mouth 2 (two) times daily for 7 days.  Dispense: 28 tablet; Refill: 0 - DG Chest 2 View - Ambulatory referral to Pulmonology  Rectal bleeding Intermittent rectal bleeding for the past month.  States that it is small amounts and from her hemorrhoid.  She is not symptomatically anemic today without shortness of breath, lightheadedness etc. -CBC -ED/return precautions discussed with patient -1 week follow-up   Levin Erp, MD [facility]

## 2023-06-09 NOTE — Patient Instructions (Signed)
It was great to see you! Thank you for allowing me to participate in your care!   Our plans for today:  -I am going to start you on antibiotics to take twice a day for 7 days for recovering pneumonia -I am going to start you on an inhaler to see if this may help with some of the wheezing and cough at nighttime -Get a labs today to see what your blood levels show -Getting a chest x-ray -Referring you back to pulmonology-it is very important for you to follow-up with them  Take care and seek immediate care sooner if you develop any concerns.  Levin Erp, MD

## 2023-06-10 LAB — CBC
Hematocrit: 37.1 % (ref 34.0–46.6)
Hemoglobin: 11.6 g/dL (ref 11.1–15.9)
MCH: 28.1 pg (ref 26.6–33.0)
MCHC: 31.3 g/dL — ABNORMAL LOW (ref 31.5–35.7)
MCV: 90 fL (ref 79–97)
Platelets: 312 10*3/uL (ref 150–450)
RBC: 4.13 x10E6/uL (ref 3.77–5.28)
RDW: 12.9 % (ref 11.7–15.4)
WBC: 7.7 10*3/uL (ref 3.4–10.8)

## 2023-06-11 ENCOUNTER — Encounter: Payer: Self-pay | Admitting: Student

## 2023-06-13 ENCOUNTER — Other Ambulatory Visit: Payer: Self-pay | Admitting: Family Medicine

## 2023-06-16 ENCOUNTER — Ambulatory Visit: Payer: 59 | Admitting: Family Medicine

## 2023-06-23 ENCOUNTER — Other Ambulatory Visit: Payer: Self-pay | Admitting: Student

## 2023-06-23 DIAGNOSIS — E785 Hyperlipidemia, unspecified: Secondary | ICD-10-CM

## 2023-06-27 ENCOUNTER — Encounter: Payer: Self-pay | Admitting: Student

## 2023-06-27 ENCOUNTER — Ambulatory Visit (INDEPENDENT_AMBULATORY_CARE_PROVIDER_SITE_OTHER): Payer: 59 | Admitting: Student

## 2023-06-27 VITALS — BP 138/75 | HR 64 | Ht 62.0 in | Wt 106.6 lb

## 2023-06-27 DIAGNOSIS — J189 Pneumonia, unspecified organism: Secondary | ICD-10-CM

## 2023-06-27 DIAGNOSIS — R053 Chronic cough: Secondary | ICD-10-CM

## 2023-06-27 MED ORDER — ALBUTEROL SULFATE HFA 108 (90 BASE) MCG/ACT IN AERS
2.0000 | INHALATION_SPRAY | RESPIRATORY_TRACT | 11 refills | Status: DC | PRN
Start: 2023-06-27 — End: 2023-07-08

## 2023-06-27 MED ORDER — ALBUTEROL SULFATE HFA 108 (90 BASE) MCG/ACT IN AERS
2.0000 | INHALATION_SPRAY | RESPIRATORY_TRACT | 0 refills | Status: DC | PRN
Start: 2023-06-27 — End: 2023-06-27

## 2023-06-27 NOTE — Assessment & Plan Note (Addendum)
Still having ongoing nonproductive cough.  Chest CT few months ago showed some structural abnormality with bronchiectasis but no mass.  She has very minimal history of tobacco use.  On exam has diffused wheezing.  Given her chest CT findings and continued cough we will likely benefit from seeing a specialist. -Refilled home albuterol -Placed referral to pulmonology -provided patient's with letter to landlord to fix mold and change old carpet
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
## 2023-06-27 NOTE — Patient Instructions (Signed)
Pleasure to meet you.  Suspect your cough is most likely due to allergic reaction to mold or the data rug in your house.  I have provided you a letter for your landlord's to rectify these issues for you.  I have also refilled your albuterol inhaler.  Your checks x-ray that was done few months ago did not show cancer.

## 2023-07-08 ENCOUNTER — Other Ambulatory Visit: Payer: Self-pay | Admitting: Student

## 2023-07-08 DIAGNOSIS — J189 Pneumonia, unspecified organism: Secondary | ICD-10-CM

## 2023-09-05 ENCOUNTER — Other Ambulatory Visit: Payer: Self-pay

## 2023-09-05 DIAGNOSIS — K219 Gastro-esophageal reflux disease without esophagitis: Secondary | ICD-10-CM

## 2023-09-05 MED ORDER — OMEPRAZOLE 40 MG PO CPDR: 40.0000 mg | DELAYED_RELEASE_CAPSULE | Freq: Every day | ORAL | 3 refills | Status: DC

## 2023-09-08 ENCOUNTER — Encounter: Payer: Self-pay | Admitting: Pulmonary Disease

## 2023-09-08 ENCOUNTER — Ambulatory Visit: Payer: 59 | Admitting: Pulmonary Disease

## 2023-09-08 VITALS — BP 156/85 | HR 77 | Ht 63.0 in | Wt 102.4 lb

## 2023-09-08 DIAGNOSIS — Z87891 Personal history of nicotine dependence: Secondary | ICD-10-CM | POA: Diagnosis not present

## 2023-09-08 DIAGNOSIS — J479 Bronchiectasis, uncomplicated: Secondary | ICD-10-CM

## 2023-09-08 DIAGNOSIS — K219 Gastro-esophageal reflux disease without esophagitis: Secondary | ICD-10-CM

## 2023-09-08 DIAGNOSIS — R053 Chronic cough: Secondary | ICD-10-CM | POA: Diagnosis not present

## 2023-09-08 MED ORDER — BUDESONIDE-FORMOTEROL FUMARATE 160-4.5 MCG/ACT IN AERO
2.0000 | INHALATION_SPRAY | Freq: Two times a day (BID) | RESPIRATORY_TRACT | 12 refills | Status: DC
Start: 2023-09-08 — End: 2023-11-30
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
## 2023-09-08 NOTE — Patient Instructions (Signed)
 Nice to meet  I think symptomatic asthma could be causing your cough especially after the mildew exposure  Reflux also contributes to continue the acid medicine I think it has helped since being on it  It is also possible nasal congestion can contribute, continue Flonase as you are  To help with the cough that could be related to asthma, use Symbicort 2 puffs in the morning and 2 puffs in the evening every day.  Rinse your mouth out with water after every use.  Return to clinic in 3 months or sooner as needed with Dr. Judeth Horn

## 2023-09-12 ENCOUNTER — Encounter: Payer: Self-pay | Admitting: Family Medicine

## 2023-10-17 ENCOUNTER — Other Ambulatory Visit: Payer: Self-pay | Admitting: Family Medicine

## 2023-10-17 DIAGNOSIS — Z1231 Encounter for screening mammogram for malignant neoplasm of breast: Secondary | ICD-10-CM

## 2023-11-03 ENCOUNTER — Other Ambulatory Visit: Payer: Self-pay | Admitting: Family Medicine

## 2023-11-03 ENCOUNTER — Other Ambulatory Visit: Payer: Self-pay

## 2023-11-03 ENCOUNTER — Telehealth: Payer: Self-pay

## 2023-11-03 DIAGNOSIS — I10 Essential (primary) hypertension: Secondary | ICD-10-CM

## 2023-11-04 ENCOUNTER — Other Ambulatory Visit: Payer: Self-pay

## 2023-11-04 DIAGNOSIS — I10 Essential (primary) hypertension: Secondary | ICD-10-CM

## 2023-11-04 MED ORDER — VALSARTAN-HYDROCHLOROTHIAZIDE 160-25 MG PO TABS: 1.0000 | ORAL_TABLET | Freq: Every day | ORAL | 1 refills | Status: DC

## 2023-11-04 NOTE — Telephone Encounter (Signed)
 Patient is no longer on lisinopril -hydrochlorothiazide  as best I can tell from her med list and recent office visit notes.  She is on Valsartan -hydrochlorothiazide  instead.   Will not refill Fax request from pharmacy for lisinopril -hydrochlorothiazide 

## 2023-11-21 ENCOUNTER — Ambulatory Visit
Admission: RE | Admit: 2023-11-21 | Discharge: 2023-11-21 | Disposition: A | Discharge status: Home / Self Care | Source: Ambulatory Visit | Attending: Family Medicine | Admitting: Family Medicine

## 2023-11-21 DIAGNOSIS — Z1231 Encounter for screening mammogram for malignant neoplasm of breast: Secondary | ICD-10-CM

## 2023-11-30 ENCOUNTER — Ambulatory Visit (INDEPENDENT_AMBULATORY_CARE_PROVIDER_SITE_OTHER): Admitting: Pulmonary Disease

## 2023-11-30 ENCOUNTER — Encounter: Payer: Self-pay | Admitting: Pulmonary Disease

## 2023-11-30 VITALS — BP 146/67 | HR 70 | Temp 98.1°F | Ht 62.0 in | Wt 105.8 lb

## 2023-11-30 DIAGNOSIS — Z87891 Personal history of nicotine dependence: Secondary | ICD-10-CM

## 2023-11-30 DIAGNOSIS — R053 Chronic cough: Secondary | ICD-10-CM

## 2023-11-30 DIAGNOSIS — J479 Bronchiectasis, uncomplicated: Secondary | ICD-10-CM

## 2023-11-30 DIAGNOSIS — J45991 Cough variant asthma: Secondary | ICD-10-CM

## 2023-11-30 MED ORDER — BUDESONIDE-FORMOTEROL FUMARATE 160-4.5 MCG/ACT IN AERO: 2.0000 | INHALATION_SPRAY | Freq: Two times a day (BID) | RESPIRATORY_TRACT | 12 refills | Status: DC

## 2023-11-30 NOTE — Patient Instructions (Signed)
 I am glad you are doing better  Continue Symbicort  2 puffs twice a day, refill sent today  Return to clinic in 6 months or sooner as needed with Dr. Marygrace Snellen
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
## 2023-12-01 ENCOUNTER — Ambulatory Visit

## 2023-12-01 VITALS — Ht 62.0 in | Wt 105.0 lb

## 2023-12-01 DIAGNOSIS — Z0000 Encounter for general adult medical examination without abnormal findings: Secondary | ICD-10-CM | POA: Diagnosis not present

## 2023-12-01 NOTE — Patient Instructions (Signed)
 Ms. Mangan , Thank you for taking time out of your busy schedule to complete your Annual Wellness Visit with me. I enjoyed our conversation and look forward to speaking with you again next year. I, as well as your care team,  appreciate your ongoing commitment to your health goals. Please review the following plan we discussed and let me know if I can assist you in the future. Your Game plan/ To Do List    Referrals: If you haven't heard from the office you've been referred to, please reach out to them at the phone provided.   Follow up Visits: Next Medicare AWV with our clinical staff: 12/06/2024 AT 4:50 PM PHONE visit with Nurse Sheela Denmark   Have you seen your provider in the last 6 months (3 months if uncontrolled diabetes)? Yes Next Office Visit with your provider: Patient will call to schedule  Clinician Recommendations:  Aim for 30 minutes of exercise or brisk walking, 6-8 glasses of water, and 5 servings of fruits and vegetables each day.       This is a list of the screening recommended for you and due dates:  Health Maintenance  Topic Date Due   DEXA scan (bone density measurement)  Never done   COVID-19 Vaccine (8 - 2024-25 season) 09/15/2023   Flu Shot  02/03/2024   Medicare Annual Wellness Visit  11/30/2024   DTaP/Tdap/Td vaccine (2 - Td or Tdap) 12/13/2024   Pneumonia Vaccine  Completed   Hepatitis C Screening  Completed   Zoster (Shingles) Vaccine  Completed   HPV Vaccine  Aged Out   Meningitis B Vaccine  Aged Out   Colon Cancer Screening  Discontinued    Advanced directives: (Declined) Advance directive discussed with you today. Even though you declined this today, please call our office should you change your mind, and we can give you the proper paperwork for you to fill out. Advance Care Planning is important because it:  [x]  Makes sure you receive the medical care that is consistent with your values, goals, and preferences  [x]  It provides guidance to your family and  loved ones and reduces their decisional burden about whether or not they are making the right decisions based on your wishes.  Follow the link provided in your after visit summary or read over the paperwork we have mailed to you to help you started getting your Advance Directives in place. If you need assistance in completing these, please reach out to us  so that we can help you!  See attachments for Preventive Care and Fall Prevention Tips.

## 2023-12-01 NOTE — Progress Notes (Signed)
 Because this visit was a virtual/telehealth visit,  certain criteria was not obtained, such a blood pressure, CBG if applicable, and timed get up and go. Any medications not marked as "taking" were not mentioned during the medication reconciliation part of the visit. Any vitals not documented were not able to be obtained due to this being a telehealth visit or patient was unable to self-report a recent blood pressure reading due to a lack of equipment at home via telehealth. Vitals that have been documented are verbally provided by the patient.   Subjective:   Zerenity Bowron is a 78 y.o. who presents for a Medicare Wellness preventive visit.  As a reminder, Annual Wellness Visits don't include a physical exam, and some assessments may be limited, especially if this visit is performed virtually. We may recommend an in-person follow-up visit with your provider if needed.  Visit Complete: Virtual I connected with  Jacqulin Maus on 12/01/23 by a audio enabled telemedicine application and verified that I am speaking with the correct person using two identifiers.  Patient Location: Home  Provider Location: Office/Clinic  I discussed the limitations of evaluation and management by telemedicine. The patient expressed understanding and agreed to proceed.  Vital Signs: Because this visit was a virtual/telehealth visit, some criteria may be missing or patient reported. Any vitals not documented were not able to be obtained and vitals that have been documented are patient reported.  VideoDeclined- This patient declined Librarian, academic. Therefore the visit was completed with audio only.  Persons Participating in Visit: Patient.  AWV Questionnaire: No: Patient Medicare AWV questionnaire was not completed prior to this visit.  Cardiac Risk Factors include: advanced age (>36men, >85 women);dyslipidemia;hypertension;family history of premature cardiovascular  disease     Objective:     Today's Vitals   12/01/23 1652  Weight: 105 lb (47.6 kg)  Height: 5\' 2"  (1.575 m)  PainSc: 0-No pain   Body mass index is 19.2 kg/m.     12/01/2023    4:54 PM 06/27/2023   10:37 AM 12/21/2022    1:20 PM 08/30/2022    1:27 PM 08/12/2020    9:50 AM 12/12/2019    1:55 PM 01/13/2018    3:23 PM  Advanced Directives  Does Patient Have a Medical Advance Directive? No No No No No No No  Would patient like information on creating a medical advance directive? No - Patient declined No - Patient declined No - Patient declined No - Patient declined No - Patient declined Yes (MAU/Ambulatory/Procedural Areas - Information given) No - Patient declined    Current Medications (verified) Outpatient Encounter Medications as of 12/01/2023  Medication Sig   albuterol  (VENTOLIN  HFA) 108 (90 Base) MCG/ACT inhaler INHALE 2 PUFFS INTO THE LUNGS EVERY 4 HOURS AS NEEDED FOR WHEEZING OR SHORTNESS OF BREATH   atorvastatin  (LIPITOR) 20 MG tablet TAKE 1 TABLET(20 MG) BY MOUTH DAILY   budesonide -formoterol  (SYMBICORT ) 160-4.5 MCG/ACT inhaler Inhale 2 puffs into the lungs 2 (two) times daily.   cholecalciferol (VITAMIN D ) 1000 units tablet Take 2,000 Units by mouth daily.   ferrous sulfate  325 (65 FE) MG tablet Take 325 mg by mouth daily with breakfast.   fluticasone (FLONASE) 50 MCG/ACT nasal spray Place into both nostrils daily as needed for allergies or rhinitis.   omeprazole  (PRILOSEC) 40 MG capsule Take 1 capsule (40 mg total) by mouth daily.   polyethylene glycol (MIRALAX  / GLYCOLAX ) packet Take 17 g by mouth daily as needed.  potassium chloride  SA (KLOR-CON  M) 20 MEQ tablet TAKE 1 TABLET(20 MEQ) BY MOUTH DAILY   valsartan -hydrochlorothiazide  (DIOVAN -HCT) 160-25 MG tablet Take 1 tablet by mouth daily.   No facility-administered encounter medications on file as of 12/01/2023.    Allergies (verified) Tetracyclines & related   History: Past Medical History:  Diagnosis Date    Anemia    Breast calcification, right    Esophagitis    GERD (gastroesophageal reflux disease)    Hepatic abscess    Hepatitis    liver absess07-drained   Hiatal hernia    Hypertension    Hypopotassemia    Iron  deficiency anemia    Vitamin D  deficiency    Past Surgical History:  Procedure Laterality Date   BREAST BIOPSY  08/09/2011   Procedure: BREAST BIOPSY WITH NEEDLE LOCALIZATION;  Surgeon: Darcella Earnest, MD;  Location: [facility];  Service: General;  Laterality: Left;  needle localized at breast center of [place] left breast biopsy    BREAST EXCISIONAL BIOPSY Left    benign   LIVER BIOPSY  2007   liver absess drained   TUBAL LIGATION     Family History  Problem Relation Age of Onset   Heart disease Mother    Diabetes Mother    Hypertension Mother    Lung cancer Father    Heart disease Brother    Colon cancer Neg Hx    Breast cancer Neg Hx    BRCA 1/2 Neg Hx    Social History   Socioeconomic History   Marital status: Divorced    Spouse name: Not on file   Number of children: 0   Years of education: 14   Highest education level: Associate degree: academic program  Occupational History   Occupation: Retired  Tobacco Use   Smoking status: Former    Current packs/day: 0.00    Types: Cigarettes    Quit date: 08/02/1982    Years since quitting: 41.3   Smokeless tobacco: Never  Vaping Use   Vaping status: Never Used  Substance and Sexual Activity   Alcohol use: No   Drug use: No   Sexual activity: Not Currently  Other Topics Concern   Not on file  Social History Narrative   Patient lives alone in [place].    Patient is divorced and has no children.    Patient enjoys working out and being active.    Patients wellness is very important to her.    Social Drivers of Corporate investment banker Strain: Low Risk  (12/01/2023)   Overall Financial Resource Strain (CARDIA)    Difficulty of Paying Living Expenses: Not very hard  Food  Insecurity: No Food Insecurity (12/01/2023)   Hunger Vital Sign    Worried About Running Out of Food in the Last Year: Never true    Ran Out of Food in the Last Year: Never true  Transportation Needs: No Transportation Needs (12/01/2023)   PRAPARE - Administrator, Civil Service (Medical): No    Lack of Transportation (Non-Medical): No  Physical Activity: Sufficiently Active (12/01/2023)   Exercise Vital Sign    Days of Exercise per Week: 5 days    Minutes of Exercise per Session: 30 min  Stress: No Stress Concern Present (12/01/2023)   [employer] of Occupational Health - Occupational Stress Questionnaire    Feeling of Stress : Not at all  Social Connections: Moderately Integrated (12/01/2023)   Social Connection and Isolation Panel [NHANES]  Frequency of Communication with Friends and Family: More than three times a week    Frequency of Social Gatherings with Friends and Family: More than three times a week    Attends Religious Services: More than 4 times per year    Active Member of [employer] or Organizations: Yes    Attends Engineer, structural: More than 4 times per year    Marital Status: Divorced    Tobacco Counseling Counseling given: Not Answered    Clinical Intake:  Pre-visit preparation completed: Yes  Pain : No/denies pain Pain Score: 0-No pain     BMI - recorded: 19.2 Nutritional Status: BMI of 19-24  Normal Nutritional Risks: None Diabetes: No  No results found for: "HGBA1C"   How often do you need to have someone help you when you read instructions, pamphlets, or other written materials from your doctor or pharmacy?: 1 - Never  Interpreter Needed?: No  Information entered by :: Wreatha Sturgeon N. Kamry Faraci, LPN.   Activities of Daily Living     12/01/2023    4:58 PM  In your present state of health, do you have any difficulty performing the following activities:  Hearing? 0  Vision? 0  Difficulty concentrating or making decisions? 0   Walking or climbing stairs? 0  Dressing or bathing? 0  Doing errands, shopping? 0  Preparing Food and eating ? N  Using the Toilet? N  In the past six months, have you accidently leaked urine? N  Do you have problems with loss of bowel control? N  Managing your Medications? N  Managing your Finances? N  Housekeeping or managing your Housekeeping? N    Patient Care Team: McDiarmid, Demetra Filter, MD as PCP - General (Family Medicine) Hunsucker, Archer Kobs, MD as Consulting Physician (Pulmonary Disease)  Indicate any recent Medical Services you may have received from other than [facility] providers in the past year (date may be approximate).     Assessment:    This is a routine wellness examination for Suki.  Hearing/Vision screen Hearing Screening - Comments:: Denies hearing difficulties.  Vision Screening - Comments:: Wears readers - up to date with routine eye exams with [facility]    Goals Addressed             This Visit's Progress    Maintain quality of life. Patient enjoys working out and blessed she can.         Depression Screen     12/01/2023    4:55 PM 06/27/2023   10:36 AM 12/21/2022    1:20 PM 09/07/2022   11:19 AM 08/30/2022    1:39 PM 08/12/2020    9:44 AM 12/12/2019    1:56 PM  PHQ 2/9 Scores  PHQ - 2 Score 0 0 0 0 0 0 0  PHQ- 9 Score 0 0 0 0 0 0     Fall Risk     12/01/2023    4:54 PM 11/30/2023    2:25 PM 09/08/2023    2:49 PM 06/27/2023   10:36 AM 06/09/2023    3:12 PM  Fall Risk   Falls in the past year? 0 0 0 0 0  Number falls in past yr: 0 0  0 0  Injury with Fall? 0 0  0 0  Risk for fall due to : No Fall Risks      Follow up Falls evaluation completed        MEDICARE RISK AT HOME:  Medicare Risk at Home Any stairs  in or around the home?: No If so, are there any without handrails?: No Home free of loose throw rugs in walkways, pet beds, electrical cords, etc?: Yes Adequate lighting in your home to reduce risk of falls?: Yes Life alert?: Yes  (alarm system) Use of a cane, walker or w/c?: No Grab bars in the bathroom?: Yes Shower chair or bench in shower?: No Elevated toilet seat or a handicapped toilet?: No  TIMED UP AND GO:  Was the test performed?  No  Cognitive Function: Declined/Normal: No cognitive concerns noted by patient or family. Patient alert, oriented, able to answer questions appropriately and recall recent events. No signs of memory loss or confusion.    12/01/2023    4:55 PM  MMSE - Mini Mental State Exam  Not completed: Unable to complete        12/01/2023    4:57 PM 09/07/2022   11:21 AM 12/12/2019    2:01 PM  6CIT Screen  What Year? 0 points 0 points 0 points  What month? 0 points 0 points 0 points  What time? 0 points 0 points 0 points  Count back from 20 0 points 0 points 2 points  Months in reverse 0 points 0 points 0 points  Repeat phrase 0 points 0 points 0 points  Total Score 0 points 0 points 2 points    Immunizations Immunization History  Administered Date(s) Administered   Fluad Quad(high Dose 65+) 03/27/2019   Influenza, High Dose Seasonal PF 02/17/2015, 04/16/2018, 03/23/2022   Influenza-Unspecified 04/26/2017, 04/16/2018, 03/18/2023   Moderna Sars-Covid-2 Vaccination 08/17/2019, 09/14/2019, 05/23/2020   PFIZER Comirnaty(Gray Top)Covid-19 Tri-Sucrose Vaccine 03/23/2022   PFIZER(Purple Top)SARS-COV-2 Vaccination 03/28/2021   Pfizer(Comirnaty)Fall Seasonal Vaccine 12 years and older 08/30/2022, 03/18/2023   Pneumococcal Conjugate-13 11/20/2014   Pneumococcal Polysaccharide-23 10/19/2016   Rsv, Bivalent, Protein Subunit Rsvpref,pf Pattricia Bores) 02/20/2022   Tdap 12/14/2014   Zoster Recombinant(Shingrix) 12/14/2022, 02/17/2023    Screening Tests Health Maintenance  Topic Date Due   DEXA SCAN  Never done   COVID-19 Vaccine (8 - 2024-25 season) 09/15/2023   INFLUENZA VACCINE  02/03/2024   Medicare Annual Wellness (AWV)  11/30/2024   DTaP/Tdap/Td (2 - Td or Tdap) 12/13/2024   Pneumonia  Vaccine 52+ Years old  Completed   Hepatitis C Screening  Completed   Zoster Vaccines- Shingrix  Completed   HPV VACCINES  Aged Out   Meningococcal B Vaccine  Aged Out   Colonoscopy  Discontinued    Health Maintenance  Health Maintenance Due  Topic Date Due   DEXA SCAN  Never done   COVID-19 Vaccine (8 - 2024-25 season) 09/15/2023   Health Maintenance Items Addressed: Yes Patient aware of current care gaps.  Immunization record was verified by [facility] and updated in patient's chart.  Additional Screening:  Vision Screening: Recommended annual ophthalmology exams for early detection of glaucoma and other disorders of the eye.  Dental Screening: Recommended annual dental exams for proper oral hygiene  Community Resource Referral / Chronic Care Management: CRR required this visit?  No   CCM required this visit?  No   Plan:    I have personally reviewed and noted the following in the patient's chart:   Medical and social history Use of alcohol, tobacco or illicit drugs  Current medications and supplements including opioid prescriptions. Patient is not currently taking opioid prescriptions. Functional ability and status Nutritional status Physical activity Advanced directives List of other physicians Hospitalizations, surgeries, and ER visits in previous 12 months Vitals Screenings  to include cognitive, depression, and falls Referrals and appointments  In addition, I have reviewed and discussed with patient certain preventive protocols, quality metrics, and best practice recommendations. A written personalized care plan for preventive services as well as general preventive health recommendations were provided to patient.   Margette Sheldon, LPN   1/61/0960   After Visit Summary: (Declined) Due to this being a telephonic visit, with patients personalized plan was offered to patient but patient Declined AVS at this time   Notes: Patient aware of current care gaps.   Immunization record was verified by [facility] and updated in patient's chart.

## 2023-12-02 NOTE — Progress Notes (Deleted)
 Reviewed and agree with Dr Macky Lower plan.

## 2024-01-17 ENCOUNTER — Ambulatory Visit (INDEPENDENT_AMBULATORY_CARE_PROVIDER_SITE_OTHER)

## 2024-01-17 VITALS — BP 150/78 | HR 80 | Ht 62.0 in | Wt 102.8 lb

## 2024-01-17 DIAGNOSIS — R09A2 Foreign body sensation, throat: Secondary | ICD-10-CM

## 2024-01-17 DIAGNOSIS — M542 Cervicalgia: Secondary | ICD-10-CM

## 2024-01-17 MED ORDER — FLUTICASONE PROPIONATE 50 MCG/ACT NA SUSP: 2.0000 | Freq: Every day | NASAL | 3 refills | Status: DC

## 2024-01-17 NOTE — Patient Instructions (Signed)
 It was so good to see you today! Thank you for allowing me to take care of you.  Today we discussed the following concerns and plans:  Choking feeling - I am referring you to ENT; they should call you to make an appointment, if you do not hear from them in the next 2 weeks please let me know. - I have refilled your Flonase .  Continue to use this every day. - Continue omeprazole  40 mg daily  Neck pain - Continue your gentle exercises and stretching - If worsens please come back sooner.  If you have any concerns, please call the clinic or schedule an appointment.  It was a pleasure to take care of you today. Be well!  Lauraine Norse, DO [facility], PGY-2  Don't forget to check out the [facility] in [facility] at [address] [phone] Affordable prices on prescriptions and over-the-counter items, as well as services like vaccinations and medication home delivery.

## 2024-01-17 NOTE — Progress Notes (Signed)
    SUBJECTIVE:   CHIEF COMPLAINT / HPI:   Feels like choking/pain in back of neck Ongoing since May when she was sick from mold in her apartment - she has been using albuterol , Symbicort  with some relief. Flonase  also helps but she is out of this and it is expensive for her. Mold has been remediated. Se[facility]ella [facility].  Choking feeling - feels like something is in her throat all the time, not associated with eating or drinking. Never had this feeling before. No throat pain. Smoked for 1 year in the 80s, 2 cigarettes daily. No alcohol. Does have cough with some clear sputum, but nothing green or bloody. She does have GERD for which she takes PPI daily, though she reports this is not always helpful.  Neck pain in posterior neck started at the same time. No decreased neck ROM.  No changes in bowel or bladder function, no loss of sensation.  She is very active and does chair exercises/yoga at home daily, as well as planks.  PERTINENT  PMH / PSH: Reviewed. HTN Allergic rhinitis GERD HLD Chronic cough  OBJECTIVE:   BP (!) 150/78   Pulse 80   Ht 5' 2 (1.575 m)   Wt 102 lb 12.8 oz (46.6 kg)   SpO2 98%   BMI 18.80 kg/m   General: Well-appearing, no acute distress. HEENT: normocephalic, PERRLA, EOM grossly intact, MMM.  No LAD, neck nontender to palpation.  Neck with full active and passive ROM. Cardio: Regular rate, regular rhythm, no murmurs on exam. Pulm: Clear, no wheezing, no crackles. No increased work of breathing. Extremities: no peripheral edema. Moves all extremities equally. Neuro: Alert and oriented x3, speech normal in content, no facial asymmetry. Psych:  Cognition and judgment appear intact. Alert, communicative, and cooperative.   ASSESSMENT/PLAN:   Assessment & Plan Globus sensation Suspect this is most likely related to GERD and postnasal drip.  Lower suspicion for malignancy based on her history, however given her age and rapid onset of  symptoms it is reasonable to pursue further workup. -Will refer to ENT for additional evaluation/workup -Continue omeprazole  40 mg daily and discussed other ways to ameliorate reflux -Refilled Flonase , she will use this daily -Follow-up in 4 weeks or sooner as needed Neck pain, bilateral posterior Primarily in the mid cervical region, does not impact ROM; I suspect this is more benign etiology such as arthritis or possibly strain given her robust workout regimen.  No red flag symptoms. -Continue with gentle exercises and stretching - Follow-up if worsens or does not improve    Lauraine Norse, DO [facility]

## 2024-02-10 ENCOUNTER — Encounter (HOSPITAL_COMMUNITY): Payer: Self-pay

## 2024-02-10 ENCOUNTER — Ambulatory Visit (INDEPENDENT_AMBULATORY_CARE_PROVIDER_SITE_OTHER): Admitting: Student

## 2024-02-10 ENCOUNTER — Emergency Department (HOSPITAL_COMMUNITY): Admission: EM | Admit: 2024-02-10 | Discharge: 2024-02-10 | Disposition: A | Discharge status: Home / Self Care

## 2024-02-10 ENCOUNTER — Emergency Department (HOSPITAL_COMMUNITY)

## 2024-02-10 ENCOUNTER — Other Ambulatory Visit: Payer: Self-pay

## 2024-02-10 ENCOUNTER — Encounter: Payer: Self-pay | Admitting: Student

## 2024-02-10 VITALS — BP 137/74 | HR 76 | Ht 62.0 in | Wt 100.0 lb

## 2024-02-10 DIAGNOSIS — S0990XA Unspecified injury of head, initial encounter: Secondary | ICD-10-CM | POA: Diagnosis not present

## 2024-02-10 DIAGNOSIS — S82141A Displaced bicondylar fracture of right tibia, initial encounter for closed fracture: Secondary | ICD-10-CM

## 2024-02-10 DIAGNOSIS — M5412 Radiculopathy, cervical region: Secondary | ICD-10-CM | POA: Diagnosis not present

## 2024-02-10 DIAGNOSIS — M50221 Other cervical disc displacement at C4-C5 level: Secondary | ICD-10-CM | POA: Insufficient documentation

## 2024-02-10 DIAGNOSIS — M62838 Other muscle spasm: Secondary | ICD-10-CM

## 2024-02-10 DIAGNOSIS — S82131A Displaced fracture of medial condyle of right tibia, initial encounter for closed fracture: Secondary | ICD-10-CM | POA: Insufficient documentation

## 2024-02-10 DIAGNOSIS — R053 Chronic cough: Secondary | ICD-10-CM | POA: Diagnosis not present

## 2024-02-10 DIAGNOSIS — Y92410 Unspecified street and highway as the place of occurrence of the external cause: Secondary | ICD-10-CM | POA: Insufficient documentation

## 2024-02-10 DIAGNOSIS — J449 Chronic obstructive pulmonary disease, unspecified: Secondary | ICD-10-CM | POA: Diagnosis not present

## 2024-02-10 DIAGNOSIS — Z7951 Long term (current) use of inhaled steroids: Secondary | ICD-10-CM | POA: Insufficient documentation

## 2024-02-10 DIAGNOSIS — M25561 Pain in right knee: Secondary | ICD-10-CM | POA: Diagnosis present

## 2024-02-10 DIAGNOSIS — M5020 Other cervical disc displacement, unspecified cervical region: Secondary | ICD-10-CM

## 2024-02-10 HISTORY — DX: Displaced bicondylar fracture of right tibia, initial encounter for closed fracture: S82.141A

## 2024-02-10 MED ORDER — FLUTICASONE FUROATE-VILANTEROL 200-25 MCG/ACT IN AEPB
1.0000 | INHALATION_SPRAY | Freq: Every day | RESPIRATORY_TRACT | Status: DC
  Filled 2024-02-10: qty 28

## 2024-02-10 MED ORDER — ATORVASTATIN CALCIUM 10 MG PO TABS: 20.0000 mg | ORAL_TABLET | Freq: Every day | ORAL | Status: DC

## 2024-02-10 MED ORDER — PANTOPRAZOLE SODIUM 40 MG PO TBEC: 40.0000 mg | DELAYED_RELEASE_TABLET | Freq: Every day | ORAL | Status: DC

## 2024-02-10 MED ORDER — NAPROXEN 375 MG PO TABS: 375.0000 mg | ORAL_TABLET | Freq: Two times a day (BID) | ORAL | 0 refills | Status: DC

## 2024-02-10 MED ORDER — TIZANIDINE HCL 4 MG PO TABS
4.0000 mg | ORAL_TABLET | Freq: Four times a day (QID) | ORAL | 0 refills | Status: DC | PRN
Start: 2024-02-10 — End: 2024-02-14

## 2024-02-10 MED ORDER — OXYCODONE-ACETAMINOPHEN 5-325 MG PO TABS
1.0000 | ORAL_TABLET | Freq: Once | ORAL | Status: AC
  Administered 2024-02-10: 1 via ORAL
  Filled 2024-02-10: qty 1

## 2024-02-10 MED ORDER — ALBUTEROL SULFATE HFA 108 (90 BASE) MCG/ACT IN AERS: 2.0000 | INHALATION_SPRAY | RESPIRATORY_TRACT | Status: DC | PRN

## 2024-02-10 NOTE — Patient Instructions (Signed)
 Pleasure to meet you today.  I have sent in a prescription for a relaxant called tizanidine  which you will take as prescribed to help with the muscle spasms in your neck that you are most likely having.  Please continue to take your omeprazole  and albuterol  as prescribed.  Also in addition to your Flonase  I am giving you a sample of nasal wash to help irrigate/rinse your nose.
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
## 2024-02-10 NOTE — Discharge Instructions (Addendum)
 Your CT scan did show that you have a slipped disc in your neck that I think is causing your symptoms.  Please take the naproxen  twice daily.  You may also take the tizanidine  prescribed by your doctor earlier today.  Please call and schedule a follow-up appointment with the neurosurgeon at the number provided for reevaluation.  Your CAT scan also showed a fracture in your right knee.  Use the brace and walker when ambulating.  Follow-up with orthopedic doctor.  Return to the ER for weakness or other concerning symptoms.

## 2024-02-10 NOTE — ED Notes (Signed)
Pt ambulated with walker; tolerated well.

## 2024-02-10 NOTE — Assessment & Plan Note (Signed)
 Suspect postnasal drip contributory to her cough given additional reports of rhinorrhea. - Continue omeprazole  and albuterol  inhaler as prescribed. - Provided patient with sample nasal wash - Continue Flonase  nasal spray as prescribed - Provided patient with letter for appointment to clean or change her home carpet

## 2024-02-10 NOTE — Progress Notes (Signed)
    SUBJECTIVE:   CHIEF COMPLAINT / HPI:   Nicole Robbins is a 78 year old female with chronic cough and bronchitis who presents with neck stiffness and shortness of breath.  She experiences daily neck stiffness and a sensation of choking, described as feeling like 'somebody got you around the neck,' especially upon waking. This is accompanied by wheezing and shortness of breath. The stiffness is on the outside of her neck, with no difficulty swallowing or food getting stuck. She uses an over-the-counter asthma cream and an inhaler, which provide some relief. These symptoms have persisted since at least Nov 30, 2023.  She has chronic cough and bronchitis, managed with an inhaler. She also has a runny nose and sinus issues, treated with Flonase  nasal spray. She does not currently smoke but had a brief smoking period in 1984.  She has a history of mold exposure in her apartment, which has been addressed but may have contributed to her respiratory symptoms. She feels stressed by the situation and has involved a coalition to address issues with her apartment management.  PERTINENT  PMH / PSH: Reviewed   OBJECTIVE:   BP 137/74   Pulse 76   Ht 5' 2 (1.575 m)   Wt 100 lb (45.4 kg)   SpO2 95%   BMI 18.29 kg/m    Physical Exam General: Alert, well appearing, NAD Neck: Supple, full ROM, no tenderness. Cardiovascular: RRR, No Murmurs, Normal S2/S2 Respiratory: Coarse breath sound, No wheezing or Rales Abdomen: No distension or tenderness Extremities: No edema on extremities     ASSESSMENT/PLAN:   Chronic cough Suspect postnasal drip contributory to her cough given additional reports of rhinorrhea. - Continue omeprazole  and albuterol  inhaler as prescribed. - Provided patient with sample nasal wash - Continue Flonase  nasal spray as prescribed - Provided patient with letter for appointment to clean or change her home carpet   Neck stiffness and pain Intermittent neck stiffness  and pain likely due to muscle tension or sleep posture.  Patient denies any dysphagia or choking spells with solid food or liquid intake.  Reports symptoms are more external rather than internal - Rx tizanidine  for muscle spasm -return precautions discussed with patient.   Norleen April, MD [facility]

## 2024-02-10 NOTE — Progress Notes (Signed)
 Orthopedic Tech Progress Note Patient Details:  Anushri Casalino January 01, 1946 [id number]  Ortho Devices Type of Ortho Device: Knee Immobilizer Ortho Device/Splint Location: rle Ortho Device/Splint Interventions: Ordered, Application, Adjustment   Post Interventions Patient Tolerated: Well Instructions Provided: Care of device, Adjustment of device  Chandra Dorn PARAS 02/10/2024, 10:16 PM
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
## 2024-02-13 ENCOUNTER — Other Ambulatory Visit: Payer: Self-pay | Admitting: Student

## 2024-02-13 DIAGNOSIS — M62838 Other muscle spasm: Secondary | ICD-10-CM

## 2024-02-16 ENCOUNTER — Telehealth: Payer: Self-pay
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
## 2024-02-16 NOTE — Addendum Note (Signed)
 Addended by: Zebastian Carico C on: 02/16/2024 04:25 PM   Modules accepted: Orders
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
## 2024-02-17 ENCOUNTER — Encounter: Payer: Self-pay | Admitting: Student

## 2024-02-17 ENCOUNTER — Ambulatory Visit (INDEPENDENT_AMBULATORY_CARE_PROVIDER_SITE_OTHER): Admitting: Student

## 2024-02-17 VITALS — BP 121/79 | HR 69 | Ht 62.0 in | Wt 102.8 lb

## 2024-02-17 DIAGNOSIS — S82101A Unspecified fracture of upper end of right tibia, initial encounter for closed fracture: Secondary | ICD-10-CM | POA: Diagnosis not present

## 2024-02-17 MED ORDER — NAPROXEN 375 MG PO TABS: 375.0000 mg | ORAL_TABLET | Freq: Two times a day (BID) | ORAL | 0 refills | Status: DC

## 2024-02-17 NOTE — Patient Instructions (Signed)
 Pleasure to meet you today.  Please continue to use your knee brace and walker for ambulation.  I have sent in referral to orthopedic surgery.  You should get a call to schedule an appointment with them soon.
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
## 2024-02-17 NOTE — Telephone Encounter (Signed)
 Reviewed

## 2024-02-17 NOTE — Addendum Note (Signed)
 Addended byBETHA BALE, Odaly Peri D on: 02/17/2024 10:53 AM   Modules accepted: Orders
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
## 2024-02-24 ENCOUNTER — Telehealth: Payer: Self-pay

## 2024-02-24 DIAGNOSIS — J189 Pneumonia, unspecified organism: Secondary | ICD-10-CM

## 2024-02-24 NOTE — Telephone Encounter (Signed)
 Spoke with Dr. McDiarmid. Advised patient be evaluated in clinic early next week.   Patient needs to schedule for Tuesday to allow time to arrange transportation.   Scheduled for Tuesday PM In ATC.   Chiquita JAYSON English, RN
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
## 2024-02-27 ENCOUNTER — Other Ambulatory Visit: Payer: Self-pay | Admitting: Family Medicine

## 2024-02-27 ENCOUNTER — Encounter: Payer: Self-pay | Admitting: Family Medicine

## 2024-02-27 NOTE — Telephone Encounter (Signed)
 Reviewed and agree.

## 2024-02-28 ENCOUNTER — Other Ambulatory Visit: Payer: Self-pay

## 2024-02-28 ENCOUNTER — Emergency Department (HOSPITAL_COMMUNITY)
Admission: EM | Admit: 2024-02-28 | Discharge: 2024-02-29 | Disposition: A | Discharge status: Home / Self Care | Source: Ambulatory Visit | Attending: Emergency Medicine | Admitting: Emergency Medicine

## 2024-02-28 ENCOUNTER — Emergency Department (HOSPITAL_COMMUNITY)

## 2024-02-28 ENCOUNTER — Encounter (HOSPITAL_COMMUNITY): Payer: Self-pay

## 2024-02-28 ENCOUNTER — Emergency Department (EMERGENCY_DEPARTMENT_HOSPITAL): Admit: 2024-02-28 | Discharge: 2024-02-28 | Disposition: A | Discharge status: Home / Self Care

## 2024-02-28 ENCOUNTER — Ambulatory Visit (INDEPENDENT_AMBULATORY_CARE_PROVIDER_SITE_OTHER): Admitting: Family Medicine

## 2024-02-28 VITALS — BP 128/71 | HR 89 | Ht 62.0 in | Wt 102.6 lb

## 2024-02-28 DIAGNOSIS — M7989 Other specified soft tissue disorders: Secondary | ICD-10-CM | POA: Diagnosis not present

## 2024-02-28 DIAGNOSIS — M62838 Other muscle spasm: Secondary | ICD-10-CM

## 2024-02-28 DIAGNOSIS — I10 Essential (primary) hypertension: Secondary | ICD-10-CM | POA: Diagnosis not present

## 2024-02-28 DIAGNOSIS — Z79899 Other long term (current) drug therapy: Secondary | ICD-10-CM | POA: Diagnosis not present

## 2024-02-28 DIAGNOSIS — R600 Localized edema: Secondary | ICD-10-CM | POA: Insufficient documentation

## 2024-02-28 DIAGNOSIS — R079 Chest pain, unspecified: Secondary | ICD-10-CM

## 2024-02-28 DIAGNOSIS — J180 Bronchopneumonia, unspecified organism: Secondary | ICD-10-CM | POA: Diagnosis not present

## 2024-02-28 DIAGNOSIS — K449 Diaphragmatic hernia without obstruction or gangrene: Secondary | ICD-10-CM | POA: Insufficient documentation

## 2024-02-28 LAB — BASIC METABOLIC PANEL WITH GFR
Anion gap: 9 (ref 5–15)
BUN: 26 mg/dL — ABNORMAL HIGH (ref 8–23)
CO2: 23 mmol/L (ref 22–32)
Calcium: 8.7 mg/dL — ABNORMAL LOW (ref 8.9–10.3)
Chloride: 101 mmol/L (ref 98–111)
Creatinine, Ser: 0.87 mg/dL (ref 0.44–1.00)
GFR, Estimated: >60 mL/min (ref >60)
Glucose, Bld: 119 mg/dL — ABNORMAL HIGH (ref 70–99)
Potassium: 4.1 mmol/L (ref 3.5–5.1)
Sodium: 133 mmol/L — ABNORMAL LOW (ref 135–145)

## 2024-02-28 LAB — CBC WITH DIFFERENTIAL/PLATELET
Abs Immature Granulocytes: 0.02 K/uL (ref 0.00–0.07)
Basophils Absolute: 0.1 K/uL (ref 0.0–0.1)
Basophils Relative: 1 %
Eosinophils Absolute: 0.3 K/uL (ref 0.0–0.5)
Eosinophils Relative: 5 %
HCT: 34.5 % — ABNORMAL LOW (ref 36.0–46.0)
Hemoglobin: 10.9 g/dL — ABNORMAL LOW (ref 12.0–15.0)
Immature Granulocytes: 0 %
Lymphocytes Relative: 18 %
Lymphs Abs: 1.1 K/uL (ref 0.7–4.0)
MCH: 29.5 pg (ref 26.0–34.0)
MCHC: 31.6 g/dL (ref 30.0–36.0)
MCV: 93.2 fL (ref 80.0–100.0)
Monocytes Absolute: 0.5 K/uL (ref 0.1–1.0)
Monocytes Relative: 8 %
Neutro Abs: 4.1 K/uL (ref 1.7–7.7)
Neutrophils Relative %: 68 %
Platelets: 274 K/uL (ref 150–400)
RBC: 3.7 MIL/uL — ABNORMAL LOW (ref 3.87–5.11)
RDW: 13.1 % (ref 11.5–15.5)
WBC: 6 K/uL (ref 4.0–10.5)
nRBC: 0 % (ref 0.0–0.2)

## 2024-02-28 LAB — RESP PANEL BY RT-PCR (RSV, FLU A&B, COVID)  RVPGX2
Influenza A by PCR: NEGATIVE
Influenza B by PCR: NEGATIVE
Resp Syncytial Virus by PCR: NEGATIVE
SARS Coronavirus 2 by RT PCR: NEGATIVE

## 2024-02-28 LAB — BRAIN NATRIURETIC PEPTIDE: B Natriuretic Peptide: 32.3 pg/mL (ref 0.0–100.0)

## 2024-02-28 MED ORDER — SODIUM CHLORIDE 0.9 % IV SOLN
1.0000 g | Freq: Once | INTRAVENOUS | Status: AC
  Administered 2024-02-28: 1 g via INTRAVENOUS
  Filled 2024-02-28: qty 10

## 2024-02-28 MED ORDER — IOHEXOL 350 MG/ML SOLN
75.0000 mL | Freq: Once | INTRAVENOUS | Status: AC | PRN
  Administered 2024-02-28: 75 mL via INTRAVENOUS

## 2024-02-28 MED ORDER — SODIUM CHLORIDE 0.9 % IV SOLN
500.0000 mg | Freq: Once | INTRAVENOUS | Status: AC
  Administered 2024-02-28: 500 mg via INTRAVENOUS
  Filled 2024-02-28: qty 5

## 2024-02-28 MED ORDER — TIZANIDINE HCL 4 MG PO TABS: 4.0000 mg | ORAL_TABLET | Freq: Four times a day (QID) | ORAL | 0 refills | Status: DC | PRN

## 2024-02-28 MED ORDER — FENTANYL CITRATE PF 50 MCG/ML IJ SOSY
25.0000 ug | PREFILLED_SYRINGE | Freq: Once | INTRAMUSCULAR | Status: AC
  Administered 2024-02-28: 25 ug via INTRAVENOUS
  Filled 2024-02-28: qty 1
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
## 2024-02-28 NOTE — ED Notes (Signed)
 Called and placed PT on monitor with CCMD
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
## 2024-02-28 NOTE — ED Notes (Signed)
 Attempted IV access, unable to obtain access at this time, IV team consulted.
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
## 2024-02-28 NOTE — Progress Notes (Signed)
 Right lower extremity venous duplex has been completed. Preliminary results can be found in CV Proc through chart review.  Results were given to Lonni Camp PA.  02/28/24 3:53 PM Cathlyn Collet RVT

## 2024-02-29 MED ORDER — AZITHROMYCIN 250 MG PO TABS: 250.0000 mg | ORAL_TABLET | Freq: Every day | ORAL | 0 refills | Status: AC

## 2024-02-29 MED ORDER — CEFPODOXIME PROXETIL 200 MG PO TABS: 200.0000 mg | ORAL_TABLET | Freq: Two times a day (BID) | ORAL | 0 refills | Status: AC

## 2024-02-29 NOTE — Discharge Instructions (Addendum)
 You were given antibiotics in the emergency department for treatment of pneumonia.  Continue antibiotics for the next 5 days.  Prescriptions were sent to your pharmacy.  Take ibuprofen and Tylenol  as needed for pain.  Make sure that you keep your orthopedic surgery appointment for further evaluation of your recent knee injury.  Return to the emergency department for any new or worsening symptoms of concern.

## 2024-03-01 ENCOUNTER — Encounter: Payer: Self-pay | Admitting: Physician Assistant

## 2024-03-01 ENCOUNTER — Ambulatory Visit (INDEPENDENT_AMBULATORY_CARE_PROVIDER_SITE_OTHER): Admitting: Physician Assistant

## 2024-03-01 DIAGNOSIS — M62838 Other muscle spasm: Secondary | ICD-10-CM

## 2024-03-01 DIAGNOSIS — S82141A Displaced bicondylar fracture of right tibia, initial encounter for closed fracture: Secondary | ICD-10-CM

## 2024-03-01 MED ORDER — OXYCODONE-ACETAMINOPHEN 5-325 MG PO TABS: 1.0000 | ORAL_TABLET | Freq: Four times a day (QID) | ORAL | 0 refills | Status: AC | PRN

## 2024-03-01 MED ORDER — TIZANIDINE HCL 4 MG PO TABS: 4.0000 mg | ORAL_TABLET | Freq: Three times a day (TID) | ORAL | 0 refills | Status: DC | PRN
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
## 2024-03-07 ENCOUNTER — Other Ambulatory Visit: Payer: Self-pay | Admitting: Family Medicine

## 2024-03-07 DIAGNOSIS — I10 Essential (primary) hypertension: Secondary | ICD-10-CM

## 2024-03-12 ENCOUNTER — Other Ambulatory Visit: Payer: Self-pay | Admitting: Family Medicine

## 2024-03-15 ENCOUNTER — Encounter: Payer: Self-pay | Admitting: Family Medicine

## 2024-03-15 ENCOUNTER — Ambulatory Visit: Admitting: Family Medicine

## 2024-03-15 VITALS — BP 166/84 | HR 74 | Temp 98.2°F | Ht 62.0 in | Wt 101.0 lb

## 2024-03-15 DIAGNOSIS — Z23 Encounter for immunization: Secondary | ICD-10-CM | POA: Diagnosis not present

## 2024-03-15 DIAGNOSIS — K449 Diaphragmatic hernia without obstruction or gangrene: Secondary | ICD-10-CM | POA: Diagnosis not present

## 2024-03-15 DIAGNOSIS — J479 Bronchiectasis, uncomplicated: Secondary | ICD-10-CM

## 2024-03-15 DIAGNOSIS — J189 Pneumonia, unspecified organism: Secondary | ICD-10-CM | POA: Diagnosis not present

## 2024-03-15 DIAGNOSIS — K219 Gastro-esophageal reflux disease without esophagitis: Secondary | ICD-10-CM | POA: Diagnosis not present

## 2024-03-15 DIAGNOSIS — I10 Essential (primary) hypertension: Secondary | ICD-10-CM

## 2024-03-15 DIAGNOSIS — J309 Allergic rhinitis, unspecified: Secondary | ICD-10-CM

## 2024-03-15 MED ORDER — BUDESONIDE-FORMOTEROL FUMARATE 160-4.5 MCG/ACT IN AERO: 2.0000 | INHALATION_SPRAY | Freq: Two times a day (BID) | RESPIRATORY_TRACT | 12 refills | Status: DC

## 2024-03-15 MED ORDER — FLUTICASONE PROPIONATE 50 MCG/ACT NA SUSP: 2.0000 | Freq: Every day | NASAL | 3 refills | Status: DC

## 2024-03-15 MED ORDER — COVID-19 MRNA VAC-TRIS(PFIZER) 30 MCG/0.3ML IM SUSY: 0.3000 mL | PREFILLED_SYRINGE | Freq: Once | INTRAMUSCULAR | 0 refills | Status: DC

## 2024-03-15 MED ORDER — OMEPRAZOLE 40 MG PO CPDR: 40.0000 mg | DELAYED_RELEASE_CAPSULE | Freq: Every day | ORAL | 3 refills | Status: DC

## 2024-03-15 MED ORDER — DELSYM 15 MG PO TABS: 1.0000 | ORAL_TABLET | Freq: Two times a day (BID) | ORAL | 2 refills | Status: AC | PRN

## 2024-03-15 MED ORDER — ALBUTEROL SULFATE HFA 108 (90 BASE) MCG/ACT IN AERS: 2.0000 | INHALATION_SPRAY | RESPIRATORY_TRACT | 11 refills | Status: DC | PRN

## 2024-03-15 MED ORDER — COVID-19 MRNA VAC-TRIS(PFIZER) 30 MCG/0.3ML IM SUSY: 0.3000 mL | PREFILLED_SYRINGE | Freq: Once | INTRAMUSCULAR | 0 refills | Status: AC

## 2024-03-15 NOTE — Patient Instructions (Signed)
 Keep taking your medicine as you are  We will recheck your blood pressure when your leg is not hurting you so much.

## 2024-03-15 NOTE — Progress Notes (Unsigned)
 Nicole Robbins is {Pc accompanied by:5710} Sources of clinical information for visit is/are {Information source:60032}. Nursing assessment for this office visit was reviewed with the patient for accuracy and revision.   Previous Report(s) Reviewed: {Outside review:15817}     03/15/2024    3:03 PM  Depression screen PHQ 2/9  Decreased Interest 0  Down, Depressed, Hopeless 0  PHQ - 2 Score 0  Altered sleeping 0  Tired, decreased energy 0  Change in appetite 0  Feeling bad or failure about yourself  0  Trouble concentrating 0  Moving slowly or fidgety/restless 0  Suicidal thoughts 0  PHQ-9 Score 0   Flowsheet Row Office Visit from 03/15/2024 in [facility] Clinical Support from 12/01/2023 in [facility] Of [place]. [facility] Office Visit from 06/27/2023 in [facility] [place]  Thoughts that you would be better off dead, or of hurting yourself in some way Not at all Not at all Not at all  PHQ-9 Total Score 0 0 0       03/15/2024    3:03 PM 02/10/2024   11:42 AM 01/17/2024    1:58 PM 12/01/2023    4:54 PM 11/30/2023    2:25 PM  Fall Risk   Falls in the past year? 0 0 0 0 0  Number falls in past yr: 0 0  0 0  Injury with Fall? 0 0  0 0  Risk for fall due to :    No Fall Risks   Follow up    Falls evaluation completed        03/15/2024    3:03 PM 12/01/2023    4:55 PM 06/27/2023   10:36 AM  PHQ9 SCORE ONLY  PHQ-9 Total Score 0 0  0      Data saved with a previous flowsheet row definition    There are no preventive care reminders to display for this patient.  Health Maintenance Due  Topic Date Due   DEXA SCAN  Never done   Influenza Vaccine  02/03/2024   COVID-19 Vaccine (8 - 2024-25 season) 03/05/2024      History/P.E. limitations: {exam; limitations ed:60112}  There are no preventive care reminders to display for this  patient. There are no preventive care reminders to display for this patient.  Health Maintenance Due  Topic Date Due   DEXA SCAN  Never done   Influenza Vaccine  02/03/2024   COVID-19 Vaccine (8 - 2024-25 season) 03/05/2024     Chief Complaint  Patient presents with   Follow-up     Discussed the use of AI scribe software for clinical note transcription with the patient, who gave verbal consent to proceed.  History of Present Illness      SDOH Screenings   Food Insecurity: No Food Insecurity (12/01/2023)  Housing: Low Risk  (12/01/2023)  Transportation Needs: No Transportation Needs (12/01/2023)  Utilities: Not At Risk (12/01/2023)  Alcohol Screen: Low Risk  (12/01/2023)  Depression (PHQ2-9): Low Risk  (03/15/2024)  Financial Resource Strain: Low Risk  (12/01/2023)  Physical Activity: Sufficiently Active (12/01/2023)  Social Connections: Moderately Integrated (12/01/2023)  Stress: No Stress Concern Present (12/01/2023)  Tobacco Use: Medium Risk (03/15/2024)  Health Literacy: Adequate Health Literacy (12/01/2023)   --------------------------------------------------------------------------------------------------------------------------------------------- Visit Problem List with Assessment and Plan   Assessment and Plan Assessment & Plan  No problem-specific Assessment & Plan notes found for this encounter.

## 2024-03-16 ENCOUNTER — Encounter: Payer: Self-pay | Admitting: Family Medicine

## 2024-03-16 NOTE — Assessment & Plan Note (Signed)
 Established problem. Adequate blood pressure control.  No evidence of new end organ damage.  Tolerating medication without significant adverse effects.  Plan to continue current blood pressure medication regiment.

## 2024-03-16 NOTE — Assessment & Plan Note (Addendum)
 Established problem. Stable. Patient is at goal of productive cough. No signs of complications, medication side effects, or red flags. Continue Symbicort 

## 2024-03-22 ENCOUNTER — Other Ambulatory Visit: Payer: Self-pay

## 2024-03-22 ENCOUNTER — Ambulatory Visit: Admitting: Physician Assistant

## 2024-03-22 DIAGNOSIS — M25561 Pain in right knee: Secondary | ICD-10-CM | POA: Diagnosis not present

## 2024-03-22 DIAGNOSIS — G8929 Other chronic pain: Secondary | ICD-10-CM

## 2024-03-22 DIAGNOSIS — S82141A Displaced bicondylar fracture of right tibia, initial encounter for closed fracture: Secondary | ICD-10-CM

## 2024-03-22 MED ORDER — OXYCODONE-ACETAMINOPHEN 5-325 MG PO TABS: 1.0000 | ORAL_TABLET | Freq: Two times a day (BID) | ORAL | 0 refills | Status: AC | PRN
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
## 2024-03-23 ENCOUNTER — Encounter: Payer: Self-pay | Admitting: Physician Assistant

## 2024-03-23 NOTE — Progress Notes (Signed)
 Reviewed and agree with Dr Rennis plan.

## 2024-03-27 ENCOUNTER — Institutional Professional Consult (permissible substitution) (INDEPENDENT_AMBULATORY_CARE_PROVIDER_SITE_OTHER): Admitting: Otolaryngology

## 2024-04-09 ENCOUNTER — Other Ambulatory Visit: Payer: Self-pay | Admitting: Family Medicine

## 2024-04-12 ENCOUNTER — Ambulatory Visit (INDEPENDENT_AMBULATORY_CARE_PROVIDER_SITE_OTHER): Admitting: Physician Assistant

## 2024-04-12 DIAGNOSIS — S82141A Displaced bicondylar fracture of right tibia, initial encounter for closed fracture: Secondary | ICD-10-CM | POA: Diagnosis not present
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
## 2024-04-13 ENCOUNTER — Other Ambulatory Visit: Payer: Self-pay | Admitting: Physician Assistant

## 2024-04-13 ENCOUNTER — Other Ambulatory Visit: Payer: Self-pay | Admitting: Family Medicine

## 2024-04-13 ENCOUNTER — Encounter: Payer: Self-pay | Admitting: Physician Assistant

## 2024-04-13 DIAGNOSIS — M62838 Other muscle spasm: Secondary | ICD-10-CM

## 2024-04-13 DIAGNOSIS — J189 Pneumonia, unspecified organism: Secondary | ICD-10-CM

## 2024-04-16 MED ORDER — POTASSIUM CHLORIDE CRYS ER 20 MEQ PO TBCR: 20.0000 meq | EXTENDED_RELEASE_TABLET | Freq: Every day | ORAL | 3 refills | Status: DC

## 2024-04-16 MED ORDER — ALBUTEROL SULFATE HFA 108 (90 BASE) MCG/ACT IN AERS: 2.0000 | INHALATION_SPRAY | RESPIRATORY_TRACT | 11 refills | Status: DC | PRN

## 2024-05-01 ENCOUNTER — Other Ambulatory Visit: Payer: Self-pay | Admitting: Family Medicine

## 2024-05-01 DIAGNOSIS — M62838 Other muscle spasm: Secondary | ICD-10-CM

## 2024-05-23 ENCOUNTER — Other Ambulatory Visit: Payer: Self-pay | Admitting: Family Medicine

## 2024-05-23 DIAGNOSIS — M62838 Other muscle spasm: Secondary | ICD-10-CM

## 2024-05-23 DIAGNOSIS — E785 Hyperlipidemia, unspecified: Secondary | ICD-10-CM

## 2024-06-13 ENCOUNTER — Other Ambulatory Visit: Payer: Self-pay

## 2024-06-13 DIAGNOSIS — J309 Allergic rhinitis, unspecified: Secondary | ICD-10-CM

## 2024-06-13 DIAGNOSIS — J479 Bronchiectasis, uncomplicated: Secondary | ICD-10-CM

## 2024-06-13 DIAGNOSIS — I10 Essential (primary) hypertension: Secondary | ICD-10-CM

## 2024-06-15 ENCOUNTER — Other Ambulatory Visit: Payer: Self-pay

## 2024-06-15 ENCOUNTER — Other Ambulatory Visit: Payer: Self-pay | Admitting: Family Medicine

## 2024-06-15 DIAGNOSIS — K219 Gastro-esophageal reflux disease without esophagitis: Secondary | ICD-10-CM

## 2024-06-15 DIAGNOSIS — J189 Pneumonia, unspecified organism: Secondary | ICD-10-CM

## 2024-06-15 MED ORDER — POTASSIUM CHLORIDE CRYS ER 20 MEQ PO TBCR: 20.0000 meq | EXTENDED_RELEASE_TABLET | Freq: Every day | ORAL | 1 refills | Status: AC

## 2024-06-15 MED ORDER — VALSARTAN-HYDROCHLOROTHIAZIDE 160-25 MG PO TABS: 1.0000 | ORAL_TABLET | Freq: Every evening | ORAL | 1 refills | Status: AC

## 2024-06-15 MED ORDER — FLUTICASONE PROPIONATE 50 MCG/ACT NA SUSP: 2.0000 | Freq: Every day | NASAL | 2 refills | Status: AC

## 2024-06-15 MED ORDER — BUDESONIDE-FORMOTEROL FUMARATE 160-4.5 MCG/ACT IN AERO: 2.0000 | INHALATION_SPRAY | Freq: Two times a day (BID) | RESPIRATORY_TRACT | 3 refills | Status: AC

## 2024-06-18 MED ORDER — ALBUTEROL SULFATE HFA 108 (90 BASE) MCG/ACT IN AERS: 2.0000 | INHALATION_SPRAY | RESPIRATORY_TRACT | 11 refills | Status: AC | PRN

## 2024-06-18 MED ORDER — OMEPRAZOLE 40 MG PO CPDR: 40.0000 mg | DELAYED_RELEASE_CAPSULE | Freq: Every day | ORAL | 3 refills | Status: AC

## 2024-06-22 ENCOUNTER — Telehealth: Payer: Self-pay

## 2024-06-22 NOTE — Telephone Encounter (Signed)
 Received call from [facility] regarding refills on atorvastatin , tizanidine  and naproxen .   Per chart review, patient has refills at local [facility].   [facility] is not on file.   Called patient to verify this request.   Patient did not answer, LVM asking her to call back regarding request.   Chiquita JAYSON English, RN

## 2024-07-02 ENCOUNTER — Other Ambulatory Visit: Payer: Self-pay

## 2024-07-02 DIAGNOSIS — E785 Hyperlipidemia, unspecified: Secondary | ICD-10-CM

## 2024-07-02 DIAGNOSIS — M62838 Other muscle spasm: Secondary | ICD-10-CM

## 2024-07-02 NOTE — Telephone Encounter (Signed)
 Spoke with patient she did confirm that she is starting to use [facility]. Nelson Land, CMA

## 2024-07-03 ENCOUNTER — Other Ambulatory Visit: Payer: Self-pay | Admitting: Family Medicine

## 2024-07-03 DIAGNOSIS — M62838 Other muscle spasm: Secondary | ICD-10-CM

## 2024-07-03 DIAGNOSIS — E785 Hyperlipidemia, unspecified: Secondary | ICD-10-CM

## 2024-07-09 MED ORDER — TIZANIDINE HCL 4 MG PO TABS: 4.0000 mg | ORAL_TABLET | Freq: Four times a day (QID) | ORAL | 3 refills | Status: AC | PRN

## 2024-07-09 MED ORDER — NAPROXEN 375 MG PO TABS: 375.0000 mg | ORAL_TABLET | Freq: Two times a day (BID) | ORAL | 3 refills | Status: AC

## 2024-07-09 MED ORDER — ATORVASTATIN CALCIUM 20 MG PO TABS: 20.0000 mg | ORAL_TABLET | Freq: Every day | ORAL | 3 refills | Status: AC

## 2024-12-06 ENCOUNTER — Encounter
